# Patient Record
Sex: Male | Born: 1950 | Race: White | Hispanic: No | Marital: Single | Smoking: Current every day smoker
Health system: Southern US, Community
[De-identification: ages and names within clinical notes are randomized; demographics above are authoritative.]

## PROBLEM LIST (undated history)

## (undated) DIAGNOSIS — I509 Heart failure, unspecified: Secondary | ICD-10-CM

## (undated) DIAGNOSIS — C4499 Other specified malignant neoplasm of skin, unspecified: Secondary | ICD-10-CM

## (undated) DIAGNOSIS — E785 Hyperlipidemia, unspecified: Secondary | ICD-10-CM

## (undated) DIAGNOSIS — F419 Anxiety disorder, unspecified: Secondary | ICD-10-CM

## (undated) DIAGNOSIS — I219 Acute myocardial infarction, unspecified: Secondary | ICD-10-CM

## (undated) DIAGNOSIS — N400 Enlarged prostate without lower urinary tract symptoms: Secondary | ICD-10-CM

## (undated) DIAGNOSIS — F17200 Nicotine dependence, unspecified, uncomplicated: Secondary | ICD-10-CM

## (undated) DIAGNOSIS — I10 Essential (primary) hypertension: Secondary | ICD-10-CM

## (undated) DIAGNOSIS — F319 Bipolar disorder, unspecified: Secondary | ICD-10-CM

## (undated) DIAGNOSIS — M1990 Unspecified osteoarthritis, unspecified site: Secondary | ICD-10-CM

## (undated) DIAGNOSIS — I2510 Atherosclerotic heart disease of native coronary artery without angina pectoris: Secondary | ICD-10-CM

## (undated) DIAGNOSIS — J449 Chronic obstructive pulmonary disease, unspecified: Secondary | ICD-10-CM

## (undated) DIAGNOSIS — S0990XA Unspecified injury of head, initial encounter: Secondary | ICD-10-CM

## (undated) DIAGNOSIS — F1021 Alcohol dependence, in remission: Secondary | ICD-10-CM

## (undated) DIAGNOSIS — C801 Malignant (primary) neoplasm, unspecified: Secondary | ICD-10-CM

## (undated) HISTORY — DX: Anxiety disorder, unspecified: F41.9

## (undated) HISTORY — DX: Unspecified osteoarthritis, unspecified site: M19.90

## (undated) HISTORY — PX: CHOLECYSTECTOMY: SHX55

## (undated) HISTORY — DX: Atherosclerotic heart disease of native coronary artery without angina pectoris: I25.10

## (undated) HISTORY — DX: Nicotine dependence, unspecified, uncomplicated: F17.200

## (undated) HISTORY — PX: CARDIAC CATHETERIZATION: SHX172

## (undated) HISTORY — DX: Unspecified injury of head, initial encounter: S09.90XA

## (undated) HISTORY — DX: Acute myocardial infarction, unspecified: I21.9

## (undated) HISTORY — DX: Benign prostatic hyperplasia without lower urinary tract symptoms: N40.0

## (undated) HISTORY — PX: CARDIAC SURGERY: SHX584

## (undated) HISTORY — PX: APPENDECTOMY: SHX54

## (undated) HISTORY — DX: Bipolar disorder, unspecified: F31.9

## (undated) HISTORY — PX: COLON SURGERY: SHX602

## (undated) HISTORY — PX: CORONARY ANGIOPLASTY WITH STENT PLACEMENT: SHX49

## (undated) HISTORY — PX: CORONARY ANGIOPLASTY: SHX604

## (undated) HISTORY — DX: Hyperlipidemia, unspecified: E78.5

## (undated) HISTORY — DX: Alcohol dependence, in remission: F10.21

## (undated) HISTORY — PX: LIVER SURGERY: SHX698

## (undated) HISTORY — PX: ABDOMINAL SURGERY: SHX537

## (undated) HISTORY — DX: Other specified malignant neoplasm of skin, unspecified: C44.99

---

## 2013-07-02 HISTORY — PX: FOOT SURGERY: SHX648

## 2013-07-02 HISTORY — PX: OTHER SURGICAL HISTORY: SHX169

## 2013-07-02 HISTORY — PX: LYMPH NODE BIOPSY: SHX201

## 2014-10-01 DIAGNOSIS — S0990XA Unspecified injury of head, initial encounter: Secondary | ICD-10-CM

## 2014-10-01 HISTORY — DX: Unspecified injury of head, initial encounter: S09.90XA

## 2016-04-02 ENCOUNTER — Emergency Department: Payer: Medicare Other

## 2016-04-02 ENCOUNTER — Inpatient Hospital Stay
Admission: EM | Admit: 2016-04-02 | Discharge: 2016-04-04 | DRG: 190 | Disposition: A | Discharge status: Home / Self Care | Payer: Medicare Other | Attending: Internal Medicine | Admitting: Internal Medicine

## 2016-04-02 ENCOUNTER — Encounter: Payer: Self-pay | Admitting: Emergency Medicine

## 2016-04-02 DIAGNOSIS — F1290 Cannabis use, unspecified, uncomplicated: Secondary | ICD-10-CM | POA: Diagnosis present

## 2016-04-02 DIAGNOSIS — D696 Thrombocytopenia, unspecified: Secondary | ICD-10-CM | POA: Diagnosis present

## 2016-04-02 DIAGNOSIS — I509 Heart failure, unspecified: Secondary | ICD-10-CM | POA: Diagnosis present

## 2016-04-02 DIAGNOSIS — E876 Hypokalemia: Secondary | ICD-10-CM | POA: Diagnosis present

## 2016-04-02 DIAGNOSIS — Z8249 Family history of ischemic heart disease and other diseases of the circulatory system: Secondary | ICD-10-CM

## 2016-04-02 DIAGNOSIS — Z79899 Other long term (current) drug therapy: Secondary | ICD-10-CM

## 2016-04-02 DIAGNOSIS — I10 Essential (primary) hypertension: Secondary | ICD-10-CM

## 2016-04-02 DIAGNOSIS — K760 Fatty (change of) liver, not elsewhere classified: Secondary | ICD-10-CM | POA: Diagnosis present

## 2016-04-02 DIAGNOSIS — E871 Hypo-osmolality and hyponatremia: Secondary | ICD-10-CM | POA: Diagnosis present

## 2016-04-02 DIAGNOSIS — R042 Hemoptysis: Secondary | ICD-10-CM | POA: Diagnosis present

## 2016-04-02 DIAGNOSIS — Z23 Encounter for immunization: Secondary | ICD-10-CM | POA: Diagnosis not present

## 2016-04-02 DIAGNOSIS — I700 Atherosclerosis of aorta: Secondary | ICD-10-CM | POA: Diagnosis present

## 2016-04-02 DIAGNOSIS — J441 Chronic obstructive pulmonary disease with (acute) exacerbation: Secondary | ICD-10-CM | POA: Diagnosis present

## 2016-04-02 DIAGNOSIS — I712 Thoracic aortic aneurysm, without rupture: Secondary | ICD-10-CM | POA: Diagnosis present

## 2016-04-02 DIAGNOSIS — J440 Chronic obstructive pulmonary disease with acute lower respiratory infection: Principal | ICD-10-CM | POA: Diagnosis present

## 2016-04-02 DIAGNOSIS — Z85828 Personal history of other malignant neoplasm of skin: Secondary | ICD-10-CM

## 2016-04-02 DIAGNOSIS — R05 Cough: Secondary | ICD-10-CM | POA: Diagnosis not present

## 2016-04-02 DIAGNOSIS — F17210 Nicotine dependence, cigarettes, uncomplicated: Secondary | ICD-10-CM | POA: Diagnosis present

## 2016-04-02 DIAGNOSIS — I110 Hypertensive heart disease with heart failure: Secondary | ICD-10-CM | POA: Diagnosis present

## 2016-04-02 DIAGNOSIS — J189 Pneumonia, unspecified organism: Secondary | ICD-10-CM

## 2016-04-02 HISTORY — DX: Heart failure, unspecified: I50.9

## 2016-04-02 HISTORY — DX: Essential (primary) hypertension: I10

## 2016-04-02 HISTORY — DX: Chronic obstructive pulmonary disease, unspecified: J44.9

## 2016-04-02 HISTORY — DX: Malignant (primary) neoplasm, unspecified: C80.1

## 2016-04-02 LAB — MRSA PCR SCREENING: MRSA BY PCR: NEGATIVE

## 2016-04-02 LAB — CBC
HCT: 41.3 % (ref 40.0–52.0)
HEMOGLOBIN: 14.7 g/dL (ref 13.0–18.0)
MCH: 34.7 pg — ABNORMAL HIGH (ref 26.0–34.0)
MCHC: 35.7 g/dL (ref 32.0–36.0)
MCV: 97.1 fL (ref 80.0–100.0)
Platelets: 119 10*3/uL — ABNORMAL LOW (ref 150–440)
RBC: 4.25 MIL/uL — AB (ref 4.40–5.90)
RDW: 14.1 % (ref 11.5–14.5)
WBC: 6.1 10*3/uL (ref 3.8–10.6)

## 2016-04-02 LAB — BLOOD GAS, VENOUS
Acid-Base Excess: 1.9 mmol/L (ref 0.0–2.0)
Bicarbonate: 26.6 mmol/L (ref 20.0–28.0)
O2 Saturation: 85 %
PH VEN: 7.42 (ref 7.250–7.430)
Patient temperature: 37
pCO2, Ven: 41 mmHg — ABNORMAL LOW (ref 44.0–60.0)
pO2, Ven: 49 mmHg — ABNORMAL HIGH (ref 32.0–45.0)

## 2016-04-02 LAB — COMPREHENSIVE METABOLIC PANEL
ALBUMIN: 3.7 g/dL (ref 3.5–5.0)
ALK PHOS: 76 U/L (ref 38–126)
ALT: 19 U/L (ref 17–63)
ANION GAP: 6 (ref 5–15)
AST: 22 U/L (ref 15–41)
BILIRUBIN TOTAL: 1.3 mg/dL — AB (ref 0.3–1.2)
BUN: 7 mg/dL (ref 6–20)
CALCIUM: 8.5 mg/dL — AB (ref 8.9–10.3)
CO2: 26 mmol/L (ref 22–32)
Chloride: 97 mmol/L — ABNORMAL LOW (ref 101–111)
Creatinine, Ser: 0.75 mg/dL (ref 0.61–1.24)
GFR calc non Af Amer: >60 mL/min (ref >60)
Glucose, Bld: 112 mg/dL — ABNORMAL HIGH (ref 65–99)
POTASSIUM: 3.4 mmol/L — AB (ref 3.5–5.1)
SODIUM: 129 mmol/L — AB (ref 135–145)
TOTAL PROTEIN: 7.2 g/dL (ref 6.5–8.1)

## 2016-04-02 LAB — TYPE AND SCREEN
ABO/RH(D): A POS
ANTIBODY SCREEN: NEGATIVE

## 2016-04-02 LAB — EXPECTORATED SPUTUM ASSESSMENT W GRAM STAIN, RFLX TO RESP C

## 2016-04-02 LAB — MAGNESIUM: MAGNESIUM: 1.6 mg/dL — AB (ref 1.7–2.4)

## 2016-04-02 LAB — EXPECTORATED SPUTUM ASSESSMENT W REFEX TO RESP CULTURE

## 2016-04-02 LAB — STREP PNEUMONIAE URINARY ANTIGEN: Strep Pneumo Urinary Antigen: NEGATIVE

## 2016-04-02 LAB — TROPONIN I

## 2016-04-02 MED ORDER — ARIPIPRAZOLE 5 MG PO TABS
5.0000 mg | ORAL_TABLET | Freq: Every day | ORAL | Status: DC
  Administered 2016-04-02 – 2016-04-04 (×3): 5 mg via ORAL
  Filled 2016-04-02 (×3): qty 1

## 2016-04-02 MED ORDER — GABAPENTIN 400 MG PO CAPS
800.0000 mg | ORAL_CAPSULE | Freq: Three times a day (TID) | ORAL | Status: DC
  Administered 2016-04-02 – 2016-04-04 (×7): 800 mg via ORAL
  Filled 2016-04-02 (×7): qty 2

## 2016-04-02 MED ORDER — ONDANSETRON HCL 4 MG/2ML IJ SOLN: 4.0000 mg | Freq: Four times a day (QID) | INTRAMUSCULAR | Status: DC | PRN

## 2016-04-02 MED ORDER — HYDROXYZINE HCL 25 MG PO TABS
25.0000 mg | ORAL_TABLET | Freq: Three times a day (TID) | ORAL | Status: DC | PRN
  Administered 2016-04-02 – 2016-04-04 (×2): 25 mg via ORAL
  Filled 2016-04-02 (×2): qty 1

## 2016-04-02 MED ORDER — NICOTINE 14 MG/24HR TD PT24
14.0000 mg | MEDICATED_PATCH | Freq: Every day | TRANSDERMAL | Status: DC
  Administered 2016-04-02 – 2016-04-04 (×3): 14 mg via TRANSDERMAL
  Filled 2016-04-02 (×3): qty 1

## 2016-04-02 MED ORDER — SODIUM CHLORIDE 0.9 % IV BOLUS (SEPSIS)
1000.0000 mL | Freq: Once | INTRAVENOUS | Status: AC
  Administered 2016-04-02: 1000 mL via INTRAVENOUS

## 2016-04-02 MED ORDER — FLUTICASONE PROPIONATE 50 MCG/ACT NA SUSP
1.0000 | Freq: Every day | NASAL | Status: DC | PRN
  Filled 2016-04-02: qty 16

## 2016-04-02 MED ORDER — POTASSIUM CHLORIDE CRYS ER 20 MEQ PO TBCR
40.0000 meq | EXTENDED_RELEASE_TABLET | Freq: Once | ORAL | Status: AC
  Administered 2016-04-02: 14:00:00 40 meq via ORAL
  Filled 2016-04-02: qty 2

## 2016-04-02 MED ORDER — FAMOTIDINE 20 MG PO TABS
20.0000 mg | ORAL_TABLET | Freq: Two times a day (BID) | ORAL | Status: DC
  Administered 2016-04-02 – 2016-04-04 (×5): 20 mg via ORAL
  Filled 2016-04-02 (×5): qty 1

## 2016-04-02 MED ORDER — OXYBUTYNIN CHLORIDE ER 5 MG PO TB24
5.0000 mg | ORAL_TABLET | Freq: Every day | ORAL | Status: DC
  Administered 2016-04-02 – 2016-04-03 (×2): 5 mg via ORAL
  Filled 2016-04-02 (×2): qty 1

## 2016-04-02 MED ORDER — MOMETASONE FURO-FORMOTEROL FUM 100-5 MCG/ACT IN AERO
2.0000 | INHALATION_SPRAY | Freq: Two times a day (BID) | RESPIRATORY_TRACT | Status: DC
  Administered 2016-04-02 – 2016-04-04 (×4): 2 via RESPIRATORY_TRACT
  Filled 2016-04-02: qty 8.8

## 2016-04-02 MED ORDER — SODIUM CHLORIDE 0.9 % IV SOLN
INTRAVENOUS | Status: DC
  Administered 2016-04-02 – 2016-04-03 (×2): via INTRAVENOUS

## 2016-04-02 MED ORDER — ENOXAPARIN SODIUM 40 MG/0.4ML ~~LOC~~ SOLN
40.0000 mg | SUBCUTANEOUS | Status: DC
  Administered 2016-04-02 – 2016-04-03 (×2): 40 mg via SUBCUTANEOUS
  Filled 2016-04-02 (×2): qty 0.4

## 2016-04-02 MED ORDER — LORATADINE 10 MG PO TABS
10.0000 mg | ORAL_TABLET | Freq: Every evening | ORAL | Status: DC
  Administered 2016-04-02 – 2016-04-03 (×2): 10 mg via ORAL
  Filled 2016-04-02 (×2): qty 1

## 2016-04-02 MED ORDER — ACETAMINOPHEN 325 MG PO TABS
650.0000 mg | ORAL_TABLET | Freq: Four times a day (QID) | ORAL | Status: DC | PRN
  Administered 2016-04-02: 650 mg via ORAL
  Filled 2016-04-02: qty 2

## 2016-04-02 MED ORDER — PRAVASTATIN SODIUM 40 MG PO TABS
20.0000 mg | ORAL_TABLET | Freq: Every day | ORAL | Status: DC
  Administered 2016-04-02 – 2016-04-03 (×2): 20 mg via ORAL
  Filled 2016-04-02 (×2): qty 1

## 2016-04-02 MED ORDER — TIOTROPIUM BROMIDE MONOHYDRATE 18 MCG IN CAPS
18.0000 ug | ORAL_CAPSULE | Freq: Every day | RESPIRATORY_TRACT | Status: DC
  Administered 2016-04-02 – 2016-04-04 (×3): 18 ug via RESPIRATORY_TRACT
  Filled 2016-04-02: qty 5

## 2016-04-02 MED ORDER — IOPAMIDOL (ISOVUE-300) INJECTION 61%
75.0000 mL | Freq: Once | INTRAVENOUS | Status: AC | PRN
  Administered 2016-04-02: 75 mL via INTRAVENOUS

## 2016-04-02 MED ORDER — GUAIFENESIN 100 MG/5ML PO SOLN: 5.0000 mL | ORAL | Status: DC | PRN

## 2016-04-02 MED ORDER — NITROGLYCERIN 0.4 MG SL SUBL: 0.4000 mg | SUBLINGUAL_TABLET | SUBLINGUAL | Status: DC | PRN

## 2016-04-02 MED ORDER — IPRATROPIUM-ALBUTEROL 0.5-2.5 (3) MG/3ML IN SOLN: 3.0000 mL | Freq: Four times a day (QID) | RESPIRATORY_TRACT | Status: DC | PRN

## 2016-04-02 MED ORDER — PREDNISONE 20 MG PO TABS
20.0000 mg | ORAL_TABLET | Freq: Every day | ORAL | Status: DC
Start: 2016-04-03 — End: 2016-04-04
  Administered 2016-04-03 – 2016-04-04 (×2): 20 mg via ORAL
  Filled 2016-04-02 (×2): qty 1

## 2016-04-02 MED ORDER — INFLUENZA VAC SPLIT QUAD 0.5 ML IM SUSY
0.5000 mL | PREFILLED_SYRINGE | INTRAMUSCULAR | Status: AC
  Administered 2016-04-03: 0.5 mL via INTRAMUSCULAR
  Filled 2016-04-02: qty 0.5

## 2016-04-02 MED ORDER — DEXTROSE 5 % IV SOLN
1.0000 g | Freq: Once | INTRAVENOUS | Status: AC
  Administered 2016-04-02: 1 g via INTRAVENOUS
  Filled 2016-04-02: qty 10

## 2016-04-02 MED ORDER — DEXTROSE 5 % IV SOLN
1.0000 g | INTRAVENOUS | Status: DC
  Administered 2016-04-03 – 2016-04-04 (×2): 1 g via INTRAVENOUS
  Filled 2016-04-02 (×2): qty 10

## 2016-04-02 MED ORDER — AZITHROMYCIN 500 MG IV SOLR
500.0000 mg | INTRAVENOUS | Status: DC
  Administered 2016-04-02 – 2016-04-04 (×3): 500 mg via INTRAVENOUS
  Filled 2016-04-02 (×3): qty 500

## 2016-04-02 NOTE — Clinical Social Work Note (Signed)
Clinical Social Work Assessment  Patient Details  Name: Victor Chapman MRN: [id number] Date of Birth: [birth year]  Date of referral:  04/02/16               Reason for consult:  Discharge Planning                Permission sought to share information with:    Permission granted to share information::     Name::        Agency::     Relationship::     Contact Information:     Housing/Transportation Living arrangements for the past 2 months:  Single Family Home Source of Information:  Patient Patient Interpreter Needed:  None Criminal Activity/Legal Involvement Pertinent to Current Situation/Hospitalization:  No - Comment as needed Significant Relationships:  Friend Lives with:  Friends Do you feel safe going back to the place where you live?  Yes Need for family participation in patient care:  No (Coment)  Care giving concerns:  Patient repots that he's homeless.    Social Worker assessment / plan:  CSW met with patient at bedside. Introduced herself and her role. Per patient he's living with a friend. Stated that he'll return at discharge and that his friend will pick him up. Stated he's been there for about 1 week. Reported that his brother died recently and he was taking care of him before he died. Stated he left his brother's house because he couldn't stay there. Stated that he is interested in resources in this area. CSW provided patient with [facility] [employer] and explained each resource to patient. Patient stated he'd use them if he needed them.   Employment status:  Unemployed Nurse, adult PT Recommendations:  Not assessed at this time Information / Referral to community resources:   (Homeless Resources )  Patient/Family's Response to care:  Patient thanked CSW for assistance.  Patient/Family's Understanding of and Emotional Response to Diagnosis, Current Treatment, and Prognosis:  Patient reported he understood  Emotional  Assessment Appearance:  Appears stated age Attitude/Demeanor/Rapport:   (None) Affect (typically observed):  Accepting, Calm, Pleasant Orientation:  Oriented to Self, Oriented to Place, Oriented to  Time, Oriented to Situation Alcohol / Substance use:  Not Applicable Psych involvement (Current and /or in the community):  No (Comment)  Discharge Needs  Concerns to be addressed:  Discharge Planning Concerns Readmission within the last 30 days:  No Current discharge risk:  Chronically ill Barriers to Discharge:  Continued Medical Work up   [employer], [place] 04/02/2016, 4:43 PM

## 2016-04-02 NOTE — ED Notes (Signed)
Attempted IV x2. Called Dawn RN to attempt IV.

## 2016-04-02 NOTE — Progress Notes (Signed)
While rounding nurse referred me to 128A for education on an AD. Ch went over the material and left the booklet with the Pt. [place] informed PT to let the nurse know when he was ready to move forward. CH is available for follow up as needed.    04/02/16 1100  Clinical Encounter Type  Visited With Patient  Visit Type Initial;Spiritual support;Other (Comment) (AD)  Referral From Nurse  Spiritual Encounters  Spiritual Needs Literature;Prayer

## 2016-04-02 NOTE — Progress Notes (Signed)
Pharmacy Note  Renal dose adjust abx: Pt currently ordered azithromycin and ceftriaxone. No dose adjustments for renal function needed.   Rayna Sexton, PharmD, BCPS Clinical Pharmacist 04/02/2016 10:11 AM

## 2016-04-02 NOTE — Consult Note (Signed)
[place] [facility] Consultation      Date: 04/02/2016,   MRN# [id number] Victor Chapman 08-03-1950 Code Status:     Code Status Orders        Start     Ordered   04/02/16 0939  Full code  Continuous     04/02/16 0938    Code Status History    Date Active Date Inactive Code Status Order ID Comments User Context   This patient has a current code status but no historical code status.     Hosp day:@LENGTHOFSTAYDAYS @ Referring MD: @ATDPROV @     PCP:      AdmissionWeight: 190 lb (86.2 kg)                 CurrentWeight: 189 lb 3 oz (85.8 kg) Victor Chapman is a 65 y.o. old male seen in consultation for hemoptysis at the request of Dr. Bridgett Larsson.     CHIEF COMPLAINT:   Coughing up blood   HISTORY OF PRESENT ILLNESS  66 y.o. male with a known history of HTN, COPD, CHF and cancer.  -The patient complained of cough and spitted up blood since last night.  -He said that he had smoked marijuana when symptoms  started yesterday. - He also has fever,  chills and mild shortness of breath. - The patient reported that he stopped taking his medication about 1 week ago.   Chest x-ray didn't show any acute cardiopulmonary disease the CAT scan show multifocal pneumonia   He has chronic SOB and DOE at baseline Has long standing extensive smoking history He feels better now. No more hemoptysis   PAST MEDICAL HISTORY   Past Medical History:  Diagnosis Date  . Cancer ([place])    skin  . CHF (congestive heart failure) ([place])   . COPD (chronic obstructive pulmonary disease) ([place])   . Hypertension      SURGICAL HISTORY   Past Surgical History:  Procedure Laterality Date  . ABDOMINAL SURGERY    . APPENDECTOMY     Part   . CARDIAC SURGERY    . CHOLECYSTECTOMY    . COLON SURGERY    . LIVER SURGERY     Part removed     FAMILY HISTORY   Family History  Problem Relation Age of Onset  . Heart disease Mother      SOCIAL HISTORY   Social History  Substance Use  Topics  . Smoking status: Current Every Day Smoker    Packs/day: 1.00    Years: 50.00  . Smokeless tobacco: Never Used  . Alcohol use No     MEDICATIONS    Home Medication:    Current Medication:  Current Facility-Administered Medications:  .  0.9 %  sodium chloride infusion, , Intravenous, Continuous, Demetrios Loll, MD, Last Rate: 75 mL/hr at 04/02/16 1409 .  acetaminophen (TYLENOL) tablet 650 mg, 650 mg, Oral, Q6H PRN, Demetrios Loll, MD, 650 mg at 04/02/16 1204 .  ARIPiprazole (ABILIFY) tablet 5 mg, 5 mg, Oral, Daily, Demetrios Loll, MD, 5 mg at 04/02/16 1204 .  azithromycin (ZITHROMAX) 500 mg in dextrose 5 % 250 mL IVPB, 500 mg, Intravenous, Q24H, Demetrios Loll, MD, 500 mg at 04/02/16 1017 .  [START ON 04/03/2016] cefTRIAXone (ROCEPHIN) 1 g in dextrose 5 % 50 mL IVPB, 1 g, Intravenous, Q24H, Demetrios Loll, MD .  enoxaparin (LOVENOX) injection 40 mg, 40 mg, Subcutaneous, Q24H, Demetrios Loll, MD .  famotidine (PEPCID) tablet 20 mg, 20 mg, Oral, BID, Demetrios Loll, MD, 20  mg at 04/02/16 1146 .  fluticasone (FLONASE) 50 MCG/ACT nasal spray 1 spray, 1 spray, Each Nare, Daily PRN, Demetrios Loll, MD .  gabapentin (NEURONTIN) capsule 800 mg, 800 mg, Oral, TID, Demetrios Loll, MD, 800 mg at 04/02/16 1543 .  guaiFENesin (ROBITUSSIN) 100 MG/5ML solution 100 mg, 5 mL, Oral, Q4H PRN, Demetrios Loll, MD .  hydrOXYzine (ATARAX/VISTARIL) tablet 25 mg, 25 mg, Oral, TID PRN, Demetrios Loll, MD .  Derrill Memo ON 04/03/2016] Influenza vac split quadrivalent PF (FLUARIX) injection 0.5 mL, 0.5 mL, Intramuscular, Tomorrow-1000, Demetrios Loll, MD .  ipratropium-albuterol (DUONEB) 0.5-2.5 (3) MG/3ML nebulizer solution 3 mL, 3 mL, Nebulization, Q6H PRN, Demetrios Loll, MD .  loratadine (CLARITIN) tablet 10 mg, 10 mg, Oral, QPM, Demetrios Loll, MD .  nicotine (NICODERM CQ - dosed in mg/24 hours) patch 14 mg, 14 mg, Transdermal, Daily, Demetrios Loll, MD, 14 mg at 04/02/16 1017 .  nitroGLYCERIN (NITROSTAT) SL tablet 0.4 mg, 0.4 mg, Sublingual, Q5 min PRN, Demetrios Loll, MD .   ondansetron [facility]) injection 4 mg, 4 mg, Intravenous, Q6H PRN, Demetrios Loll, MD .  oxybutynin (DITROPAN-XL) 24 hr tablet 5 mg, 5 mg, Oral, QHS, Demetrios Loll, MD .  pravastatin (PRAVACHOL) tablet 20 mg, 20 mg, Oral, q1800, Demetrios Loll, MD .  tiotropium [facility]) inhalation capsule 18 mcg, 18 mcg, Inhalation, Daily, Demetrios Loll, MD, 18 mcg at 04/02/16 1205    ALLERGIES   Review of patient's allergies indicates no known allergies.     REVIEW OF SYSTEMS   Review of Systems  Constitutional: Negative for chills, diaphoresis, fever, malaise/fatigue and weight loss.  HENT: Negative for congestion and hearing loss.   Eyes: Negative for blurred vision and double vision.  Respiratory: Positive for cough, hemoptysis, shortness of breath and wheezing. Negative for sputum production.   Cardiovascular: Negative for chest pain, palpitations and orthopnea.  Gastrointestinal: Negative for abdominal pain, heartburn, nausea and vomiting.  Genitourinary: Negative for dysuria and urgency.  Musculoskeletal: Negative for back pain, myalgias and neck pain.  Skin: Negative for rash.  Neurological: Negative for dizziness, tingling, tremors, weakness and headaches.  Endo/Heme/Allergies: Does not bruise/bleed easily.  Psychiatric/Behavioral: Negative for depression, substance abuse and suicidal ideas.  All other systems reviewed and are negative.    VS: BP (!) 149/92 (BP Location: Right Arm)   Pulse 71   Temp 97.8 F (36.6 C) (Oral)   Resp (!) 29   Ht 5\' 9"  (1.753 m)   Wt 189 lb 3 oz (85.8 kg)   SpO2 97%   BMI 27.94 kg/m      PHYSICAL EXAM  Physical Exam  Constitutional: He is oriented to person, place, and time. He appears well-developed and well-nourished. No distress.  HENT:  Head: Normocephalic and atraumatic.  Mouth/Throat: No oropharyngeal exudate.  Eyes: EOM are normal. Pupils are equal, round, and reactive to light. No scleral icterus.  Neck: Normal range of motion. Neck supple.    Cardiovascular: Normal rate, regular rhythm and normal heart sounds.   No murmur heard. Pulmonary/Chest: No stridor. No respiratory distress. He has wheezes. He has no rales. He exhibits no tenderness.  Abdominal: Soft. Bowel sounds are normal.  Musculoskeletal: Normal range of motion. He exhibits no edema.  Neurological: He is alert and oriented to person, place, and time. No cranial nerve deficit.  Skin: Skin is warm. He is not diaphoretic.  Psychiatric: He has a normal mood and affect.        LABS    Recent Labs  04/02/16  0156  HGB  14.7  HCT  41.3  MCV  97.1  WBC  6.1  BUN  7  CREATININE  0.75  GLUCOSE  112*  CALCIUM  8.5*  ,      CULTURE RESULTS   Recent Results (from the past 240 hour(s))  MRSA PCR Screening     Status: None   Collection Time: 04/02/16 10:07 AM  Result Value Ref Range Status   MRSA by PCR NEGATIVE NEGATIVE Final    Comment:        The GeneXpert MRSA Assay (FDA approved for NASAL specimens only), is one component of a comprehensive MRSA colonization surveillance program. It is not intended to diagnose MRSA infection nor to guide or monitor treatment for MRSA infections.   Culture, sputum-assessment     Status: None   Collection Time: 04/02/16  2:10 PM  Result Value Ref Range Status   Specimen Description EXPECTORATED SPUTUM  Final   Special Requests NONE  Final   Sputum evaluation THIS SPECIMEN IS ACCEPTABLE FOR SPUTUM CULTURE  Final   Report Status 04/02/2016 FINAL  Final          IMAGING    Dg Chest 2 View  Result Date: 04/02/2016 CLINICAL DATA:  65 y/o  M; 2 days of coughing up blood. EXAM: CHEST  2 VIEW COMPARISON:  None. FINDINGS: Normal cardiomediastinal silhouette. Nodular density in the right costodiaphragmatic recess measuring 15 mm. No focal consolidation. No pleural effusion or pneumothorax. Mild multilevel degenerative changes of the spine. IMPRESSION: No active cardiopulmonary disease. Nodular density  projecting over right costodiaphragmatic recess of uncertain significance, consider CT of the chest for further characterization. Electronically Signed   By: Kristine Garbe M.D.   On: 04/02/2016 04:01   Ct Chest W Contrast  Result Date: 04/02/2016 CLINICAL DATA:  Hemoptysis beginning after smoking.  History of CHF. EXAM: CT CHEST WITH CONTRAST TECHNIQUE: Multidetector CT imaging of the chest was performed during intravenous contrast administration. CONTRAST:  46mL ISOVUE-300 IOPAMIDOL (ISOVUE-300) INJECTION 61% COMPARISON:  Chest radiograph 04/02/2016 FINDINGS: Cardiovascular: Normal heart size. Prominent coronary artery calcifications. Normal caliber thoracic aorta with calcific and noncalcific plaque formation. Suggestion of penetrating ulcers in the lower descending thoracic aorta. No dissection. Great vessel origins are patent. Mediastinum/Nodes: No enlarged mediastinal, hilar, or axillary lymph nodes. Thyroid gland, trachea, and esophagus demonstrate no significant findings. Lungs/Pleura: Multiple patchy focal areas of airspace infiltration in both lungs likely to represent multifocal pneumonia. In the setting of hemoptysis, parenchymal hemorrhage could also have this appearance. No focal nodules demonstrated in the right costophrenic angle region. No pleural effusions. No pneumothorax. Airways are patent. Upper Abdomen: Diffuse fatty infiltration of the liver. Surgical absence of the gallbladder. Pneumobilia is likely postoperative. Spleen is mildly enlarged. Musculoskeletal: Mild degenerative changes in the spine and at sternomanubrial joint. No destructive bone lesions. IMPRESSION: Patchy airspace infiltrates in the lungs suggesting multifocal pneumonia. Prominent coronary artery calcifications. Aortic atherosclerosis with suggestion of penetrating ulcers in the descending aorta. Fatty infiltration of the liver. Pneumobilia is likely postoperative. Electronically Signed   By: Lucienne Capers  M.D.   On: 04/02/2016 05:52     Images reviewed 04/02/2016 GGO mostly seen in [facility]     ASSESSMENT/PLAN   65 yo white male seen today for acute hemoptysis from acute bronchitis/pneumonia in setting of high probability of COPD exacerbation  1.continue abx as prescribed 2.start oral prednisone 20 mg daily for 7 days 3.start dulera 4.continue spiriva  Follow up in [facility] in 2-4 weeks after discharge    I have personally obtained a history, examined the patient, evaluated laboratory and independently reviewed imaging results, formulated the assessment and plan and placed orders.  The Patient requires high complexity decision making for assessment and support, frequent evaluation and titration of therapies, application of advanced monitoring technologies and extensive interpretation of multiple databases.   Patient are satisfied with Plan of action and management. All questions answered  Corrin Parker, M.D.  [facility]  Medical Director [place] Director [facility] Cardio-Pulmonary Department

## 2016-04-02 NOTE — Consult Note (Signed)
[facility] Consult Note  MRN : [id number]  Victor Chapman is a 65 y.o. (Sep 04, 1950) male who presents with chief complaint of  Chief Complaint  Patient presents with  . Hematemesis  .  History of Present Illness: I am asked to see the patient by Dr. Bridgett Larsson for evaluation of an abnormality seen on a CT scan. The patient was admitted with hemoptysis and coughing. He is somewhat somnolent at my exam today, but his coughing has improved and he does not appear to be having any worsening symptoms. He does not complain of significant chest or back pain. He has no current shortness of breath. He has no fever or chills. Given his extensive tobacco history and other risk factors a CT scan was performed which I have independently reviewed. This is described below, but from a vascular standpoint significant irregularity is seen in the descending thoracic aorta. This is interpreted as a penetrating ulcer and there may be a small penetrating ulcer, but there is significant mural thrombus and irregularity throughout much of the descending thoracic aorta. There is no aneurysm and no stenosis present at this time. He does not currently have any signs of peripheral embolization and does not describe claudication symptoms.  Current Facility-Administered Medications  Medication Dose Route Frequency Provider Last Rate Last Dose  . 0.9 %  sodium chloride infusion   Intravenous Continuous Demetrios Loll, MD 75 mL/hr at 04/02/16 1409    . acetaminophen (TYLENOL) tablet 650 mg  650 mg Oral Q6H PRN Demetrios Loll, MD   650 mg at 04/02/16 1204  . ARIPiprazole (ABILIFY) tablet 5 mg  5 mg Oral Daily Demetrios Loll, MD   5 mg at 04/02/16 1204  . azithromycin (ZITHROMAX) 500 mg in dextrose 5 % 250 mL IVPB  500 mg Intravenous Q24H Demetrios Loll, MD   500 mg at 04/02/16 1017  . [START ON 04/03/2016] cefTRIAXone (ROCEPHIN) 1 g in dextrose 5 % 50 mL IVPB  1 g Intravenous Q24H Demetrios Loll, MD      . enoxaparin (LOVENOX)  injection 40 mg  40 mg Subcutaneous Q24H Demetrios Loll, MD      . famotidine (PEPCID) tablet 20 mg  20 mg Oral BID Demetrios Loll, MD   20 mg at 04/02/16 1146  . fluticasone (FLONASE) 50 MCG/ACT nasal spray 1 spray  1 spray Each Nare Daily PRN Demetrios Loll, MD      . gabapentin (NEURONTIN) capsule 800 mg  800 mg Oral TID Demetrios Loll, MD   800 mg at 04/02/16 1147  . guaiFENesin (ROBITUSSIN) 100 MG/5ML solution 100 mg  5 mL Oral Q4H PRN Demetrios Loll, MD      . hydrOXYzine (ATARAX/VISTARIL) tablet 25 mg  25 mg Oral TID PRN Demetrios Loll, MD      . Derrill Memo ON 04/03/2016] Influenza vac split quadrivalent PF (FLUARIX) injection 0.5 mL  0.5 mL Intramuscular Tomorrow-1000 Demetrios Loll, MD      . ipratropium-albuterol (DUONEB) 0.5-2.5 (3) MG/3ML nebulizer solution 3 mL  3 mL Nebulization Q6H PRN Demetrios Loll, MD      . loratadine (CLARITIN) tablet 10 mg  10 mg Oral QPM Demetrios Loll, MD      . nicotine (NICODERM CQ - dosed in mg/24 hours) patch 14 mg  14 mg Transdermal Daily Demetrios Loll, MD   14 mg at 04/02/16 1017  . nitroGLYCERIN (NITROSTAT) SL tablet 0.4 mg  0.4 mg Sublingual Q5 min PRN Demetrios Loll, MD      .  ondansetron (ZOFRAN) injection 4 mg  4 mg Intravenous Q6H PRN Demetrios Loll, MD      . oxybutynin (DITROPAN-XL) 24 hr tablet 5 mg  5 mg Oral QHS Demetrios Loll, MD      . pravastatin (PRAVACHOL) tablet 20 mg  20 mg Oral q1800 Demetrios Loll, MD      . tiotropium [facility]) inhalation capsule 18 mcg  18 mcg Inhalation Daily Demetrios Loll, MD   18 mcg at 04/02/16 1205    Past Medical History:  Diagnosis Date  . Cancer ([place])    skin  . CHF (congestive heart failure) ([place])   . COPD (chronic obstructive pulmonary disease) ([place])   . Hypertension     Past Surgical History:  Procedure Laterality Date  . ABDOMINAL SURGERY    . APPENDECTOMY     Part   . CARDIAC SURGERY    . CHOLECYSTECTOMY    . COLON SURGERY    . LIVER SURGERY     Part removed    Social History Social History  Substance Use Topics  . Smoking status: Current Every Day Smoker     Packs/day: 1.00    Years: 50.00  . Smokeless tobacco: Never Used  . Alcohol use No  No IV drug use  Family History Family History  Problem Relation Age of Onset  . Heart disease Mother   No family history of clotting disorders, bleeding disorders, or aneurysms reported  No Known Allergies   REVIEW OF SYSTEMS (Negative unless checked)  Constitutional: [] Weight loss  [] Fever  [] Chills Cardiac: [] Chest pain   [] Chest pressure   [] Palpitations   [] Shortness of breath when laying flat   [] Shortness of breath at rest   [] Shortness of breath with exertion. Vascular:  [] Pain in legs with walking   [] Pain in legs at rest   [] Pain in legs when laying flat   [] Claudication   [] Pain in feet when walking  [] Pain in feet at rest  [] Pain in feet when laying flat   [] History of DVT   [] Phlebitis   [] Swelling in legs   [] Varicose veins   [] Non-healing ulcers Pulmonary:   [] Uses home oxygen   [x] Productive cough   [x] Hemoptysis   [] Wheeze  [x] COPD   [] Asthma Neurologic:  [] Dizziness  [] Blackouts   [] Seizures   [] History of stroke   [] History of TIA  [] Aphasia   [] Temporary blindness   [] Dysphagia   [] Weakness or numbness in arms   [] Weakness or numbness in legs Musculoskeletal:  [] Arthritis   [] Joint swelling   [] Joint pain   [] Low back pain Hematologic:  [] Easy bruising  [] Easy bleeding   [] Hypercoagulable state   [] Anemic  [] Hepatitis Gastrointestinal:  [] Blood in stool   [] Vomiting blood  [x] Gastroesophageal reflux/heartburn   [] Difficulty swallowing. Genitourinary:  [] Chronic kidney disease   [] Difficult urination  [] Frequent urination  [] Burning with urination   [] Blood in urine Skin:  [] Rashes   [] Ulcers   [] Wounds Psychological:  [] History of anxiety   []  History of major depression.  Physical Examination  Vitals:   04/02/16 0900 04/02/16 0939 04/02/16 0955 04/02/16 1418  BP: (!) 168/81  (!) 158/90 (!) 149/92  Pulse: 61  66 71  Resp: (!) 29     Temp:   97.8 F (36.6 C) 97.8 F (36.6 C)   TempSrc:   Oral Oral  SpO2:  97% 98% 97%  Weight:   85.8 kg (189 lb 3 oz)   Height:   5\' 9"  (1.753 m)  Body mass index is 27.94 kg/m. Gen:  WD/WN, NAD Head: [place]/AT, No temporalis wasting. Prominent temp pulse not noted. Ear/Nose/Throat: Hearing grossly intact, nares w/o erythema or drainage, oropharynx w/o Erythema/Exudate Eyes: PERRLA, EOMI.  Neck: Supple, no nuchal rigidity.  No bruit or JVD.  Pulmonary:  Good air movement, Equal bilaterally.  Cardiac: RRR, normal S1, S2. Vascular:  Vessel Right Left  Radial Palpable Palpable  Ulnar Palpable Palpable  Brachial Palpable Palpable  Carotid Palpable, without bruit Palpable, without bruit  Aorta Not palpable N/A  Femoral Palpable Palpable  Popliteal Palpable Not Palpable  PT Not Palpable Not Palpable  DP Palpable Palpable   Gastrointestinal: soft, non-tender/non-distended. No guarding/reflex. No masses, surgical incisions, or scars. Musculoskeletal: M/S 5/5 throughout.  No deformity or atrophy. No edema. Neurologic: CN 2-12 intact. Pain and light touch intact in extremities.  Symmetrical.  Speech is fluent. Motor exam as listed above. Psychiatric: Patient is somewhat somnolent, but awakens and answers questions appropriately. Affect seems normal Dermatologic: No rashes or ulcers noted.  No cellulitis or open wounds. Lymph : No Cervical, Axillary, or Inguinal lymphadenopathy.     CBC Lab Results  Component Value Date   WBC 6.1 04/02/2016   HGB 14.7 04/02/2016   HCT 41.3 04/02/2016   MCV 97.1 04/02/2016   PLT 119 (L) 04/02/2016    BMET    Component Value Date/Time   NA 129 (L) 04/02/2016 0156   K 3.4 (L) 04/02/2016 0156   CL 97 (L) 04/02/2016 0156   CO2 26 04/02/2016 0156   GLUCOSE 112 (H) 04/02/2016 0156   BUN 7 04/02/2016 0156   CREATININE 0.75 04/02/2016 0156   CALCIUM 8.5 (L) 04/02/2016 0156   GFRNONAA >60 04/02/2016 0156   GFRAA >60 04/02/2016 0156   Estimated Creatinine Clearance: 98.5 mL/min (by C-G  formula based on SCr of 0.75 mg/dL).  COAG No results found for: INR, PROTIME  Radiology Dg Chest 2 View  Result Date: 04/02/2016 CLINICAL DATA:  65 y/o  M; 2 days of coughing up blood. EXAM: CHEST  2 VIEW COMPARISON:  None. FINDINGS: Normal cardiomediastinal silhouette. Nodular density in the right costodiaphragmatic recess measuring 15 mm. No focal consolidation. No pleural effusion or pneumothorax. Mild multilevel degenerative changes of the spine. IMPRESSION: No active cardiopulmonary disease. Nodular density projecting over right costodiaphragmatic recess of uncertain significance, consider CT of the chest for further characterization. Electronically Signed   By: Kristine Garbe M.D.   On: 04/02/2016 04:01   Ct Chest W Contrast  Result Date: 04/02/2016 CLINICAL DATA:  Hemoptysis beginning after smoking.  History of CHF. EXAM: CT CHEST WITH CONTRAST TECHNIQUE: Multidetector CT imaging of the chest was performed during intravenous contrast administration. CONTRAST:  23mL ISOVUE-300 IOPAMIDOL (ISOVUE-300) INJECTION 61% COMPARISON:  Chest radiograph 04/02/2016 FINDINGS: Cardiovascular: Normal heart size. Prominent coronary artery calcifications. Normal caliber thoracic aorta with calcific and noncalcific plaque formation. Suggestion of penetrating ulcers in the lower descending thoracic aorta. No dissection. Great vessel origins are patent. Mediastinum/Nodes: No enlarged mediastinal, hilar, or axillary lymph nodes. Thyroid gland, trachea, and esophagus demonstrate no significant findings. Lungs/Pleura: Multiple patchy focal areas of airspace infiltration in both lungs likely to represent multifocal pneumonia. In the setting of hemoptysis, parenchymal hemorrhage could also have this appearance. No focal nodules demonstrated in the right costophrenic angle region. No pleural effusions. No pneumothorax. Airways are patent. Upper Abdomen: Diffuse fatty infiltration of the liver. Surgical absence of  the gallbladder. Pneumobilia is likely postoperative. Spleen is mildly enlarged. Musculoskeletal: Mild degenerative  changes in the spine and at sternomanubrial joint. No destructive bone lesions. IMPRESSION: Patchy airspace infiltrates in the lungs suggesting multifocal pneumonia. Prominent coronary artery calcifications. Aortic atherosclerosis with suggestion of penetrating ulcers in the descending aorta. Fatty infiltration of the liver. Pneumobilia is likely postoperative. Electronically Signed   By: Lucienne Capers M.D.   On: 04/02/2016 05:52      Assessment/Plan 1. 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  There is clearly significant irregularity is seen in the descending thoracic aorta. This is interpreted as a penetrating ulcer and there may be a small penetrating ulcer, but there is significant mural thrombus and irregularity throughout much of the descending thoracic aorta. This is not aneurysmal or frankly stenotic at this point. He does not seem significantly symptomatic at this point. I have discussed the importance of lifestyle modifications such as smoking cessation, increasing exercise, healthy diet, and control of blood pressure. I have recommended that he remain on aspirin daily once his hemoptysis workup is concluded. This can be an 81 mg aspirin daily. He would likely benefit from a statin long-term as well. I will plan to see him back in the office in 1-2 months, and plan a follow-up CT scan sometime next year. No current intervention is required unless he developed significant back pain, chest pain, or abdominal pain that would be worrisome for extension of his penetrating ulcer or dissection. If these occur, I would recommend a repeat CT scan 2. Pneumonia. Likely the cause of his symptoms. Getting treatment with antibiotics. 3. Tobacco dependence. Smoking cessation strongly recommended. The dangers tobacco and the difficulty of  tobacco in the vascular system were discussed with the patient in detail. 4. Hypertension. Control important to avoid aneurysmal degeneration. Stable on outpatient medications.   Leotis Pain, MD  04/02/2016 2:25 PM    This note was created with Dragon medical transcription system.  Any error is purely unintentional

## 2016-04-02 NOTE — ED Triage Notes (Signed)
Pt states that he started after he was smoking a cigarette and became worse when he smoked a "joint" later. Per EMS, pt has been out of BP medicine for about a week and BP is 173/86 with PR of 76. Pt is alert and oriented, ambulatory with NAD noted at this time.

## 2016-04-02 NOTE — H&P (Signed)
[facility] [place] NAME: Victor Chapman    MR#:  [id number]  DATE OF BIRTH:  Nov 25, 1950  DATE OF ADMISSION:  04/02/2016  PRIMARY CARE PHYSICIAN: No PCP Per Patient   REQUESTING/REFERRING PHYSICIAN: Loney Hering, MD  CHIEF COMPLAINT:   Chief Complaint  Patient presents with  . Hematemesis   Cough and hemoptysis since last night. HISTORY OF PRESENT ILLNESS:  Victor Chapman  is a 65 y.o. male with a known history of HTN, COPD, CHF and cancer. The patient complained of cough and spitted up blood since last night. He said that he had smoked marijuana when suggested started yesterday. He also has fever,  chills and mild shortness of breath. The patient reported that he stopped taking his medication about 1 week ago. Chest x-ray didn't show any acute cardiopulmonary disease the CAT scan show multifocal pneumonia and pain is treated ulcers in the descending aorta. PAST MEDICAL HISTORY:   Past Medical History:  Diagnosis Date  . Cancer ([place])    skin  . CHF (congestive heart failure) ([place])   . COPD (chronic obstructive pulmonary disease) ([place])   . Hypertension     PAST SURGICAL HISTORY:   Past Surgical History:  Procedure Laterality Date  . ABDOMINAL SURGERY    . APPENDECTOMY     Part   . CARDIAC SURGERY    . CHOLECYSTECTOMY    . COLON SURGERY    . LIVER SURGERY     Part removed    SOCIAL HISTORY:   Social History  Substance Use Topics  . Smoking status: Current Every Day Smoker    Packs/day: 1.00    Years: 50.00  . Smokeless tobacco: Never Used  . Alcohol use No    FAMILY HISTORY:   Family History  Problem Relation Age of Onset  . Heart disease Mother     DRUG ALLERGIES:  No Known Allergies  REVIEW OF SYSTEMS:   Review of Systems  Constitutional: Positive for chills, fever and malaise/fatigue. Negative for weight loss.  HENT: Negative for sore throat.   Eyes: Negative for blurred vision and double vision.    Respiratory: Positive for cough, hemoptysis and shortness of breath. Negative for wheezing and stridor.   Cardiovascular: Negative for chest pain, palpitations and leg swelling.  Gastrointestinal: Negative for abdominal pain, blood in stool, diarrhea, melena, nausea and vomiting.  Genitourinary: Negative for dysuria, hematuria and urgency.  Musculoskeletal: Negative for joint pain.  Skin: Negative for itching and rash.  Neurological: Negative for dizziness, tingling, seizures, loss of consciousness and headaches.  Psychiatric/Behavioral: Negative for depression. The patient is not nervous/anxious.     MEDICATIONS AT HOME:   Prior to Admission medications   Medication Sig Start Date End Date Taking? Authorizing Provider  albuterol (PROVENTIL HFA;VENTOLIN HFA) 108 (90 Base) MCG/ACT inhaler Inhale into the lungs every 6 (six) hours as needed for wheezing or shortness of breath.   Yes Historical Provider, MD  ARIPiprazole (ABILIFY) 5 MG tablet Take 5 mg by mouth daily.   Yes Historical Provider, MD  fluticasone (FLONASE) 50 MCG/ACT nasal spray Place into both nostrils daily.   Yes Historical Provider, MD  gabapentin (NEURONTIN) 800 MG tablet Take 800 mg by mouth 3 (three) times daily.   Yes Historical Provider, MD  hydrOXYzine (ATARAX/VISTARIL) 25 MG tablet Take 25 mg by mouth 3 (three) times daily as needed.   Yes Historical Provider, MD  levocetirizine (XYZAL) 5 MG tablet Take 5 mg  by mouth every evening.   Yes Historical Provider, MD  nitroGLYCERIN (NITROSTAT) 0.4 MG SL tablet Place 0.4 mg under the tongue every 5 (five) minutes as needed for chest pain.   Yes Historical Provider, MD  ondansetron (ZOFRAN) 8 MG tablet Take by mouth every 12 (twelve) hours as needed for nausea or vomiting.   Yes Historical Provider, MD  oxybutynin (DITROPAN-XL) 5 MG 24 hr tablet Take 5 mg by mouth at bedtime.   Yes Historical Provider, MD  pravastatin (PRAVACHOL) 20 MG tablet Take 20 mg by mouth daily.   Yes  Historical Provider, MD  ranitidine (ZANTAC) 150 MG tablet Take 150 mg by mouth 2 (two) times daily.   Yes Historical Provider, MD  tiotropium (SPIRIVA) 18 MCG inhalation capsule Place 18 mcg into inhaler and inhale daily.   Yes Historical Provider, MD      VITAL SIGNS:  Blood pressure (!) 158/90, pulse 66, temperature 97.8 F (36.6 C), temperature source Oral, resp. rate (!) 29, height 5\' 9"  (1.753 m), weight 189 lb 3 oz (85.8 kg), SpO2 98 %.  PHYSICAL EXAMINATION:  Physical Exam  GENERAL:  65 y.o.-year-old patient lying in the bed with no acute distress.  EYES: Pupils equal, round, reactive to light and accommodation. No scleral icterus. Extraocular muscles intact.  HEENT: Head atraumatic, normocephalic. Oropharynx and nasopharynx clear.  NECK:  Supple, no jugular venous distention. No thyroid enlargement, no tenderness.  LUNGS: Normal breath sounds bilaterally, no wheezing, but has mild crackles bilaterally. No use of accessory muscles of respiration.  CARDIOVASCULAR: S1, S2 normal. No murmurs, rubs, or gallops.  ABDOMEN: Soft, nontender, nondistended. Bowel sounds present. No organomegaly or mass.  EXTREMITIES: No pedal edema, cyanosis, or clubbing.  NEUROLOGIC: Cranial nerves II through XII are intact. Muscle strength 5/5 in all extremities. Sensation intact. Gait not checked.  PSYCHIATRIC: The patient is alert and oriented x 3.  SKIN: No obvious rash, lesion, or ulcer.   LABORATORY PANEL:   CBC  Recent Labs Lab 04/02/16 0156  WBC 6.1  HGB 14.7  HCT 41.3  PLT 119*   ------------------------------------------------------------------------------------------------------------------  Chemistries   Recent Labs Lab 04/02/16 0156  NA 129*  K 3.4*  CL 97*  CO2 26  GLUCOSE 112*  BUN 7  CREATININE 0.75  CALCIUM 8.5*  AST 22  ALT 19  ALKPHOS 76  BILITOT 1.3*    ------------------------------------------------------------------------------------------------------------------  Cardiac Enzymes  Recent Labs Lab 04/02/16 0156  TROPONINI <0.03   ------------------------------------------------------------------------------------------------------------------  RADIOLOGY:  Dg Chest 2 View  Result Date: 04/02/2016 CLINICAL DATA:  65 y/o  M; 2 days of coughing up blood. EXAM: CHEST  2 VIEW COMPARISON:  None. FINDINGS: Normal cardiomediastinal silhouette. Nodular density in the right costodiaphragmatic recess measuring 15 mm. No focal consolidation. No pleural effusion or pneumothorax. Mild multilevel degenerative changes of the spine. IMPRESSION: No active cardiopulmonary disease. Nodular density projecting over right costodiaphragmatic recess of uncertain significance, consider CT of the chest for further characterization. Electronically Signed   By: Kristine Garbe M.D.   On: 04/02/2016 04:01   Ct Chest W Contrast  Result Date: 04/02/2016 CLINICAL DATA:  Hemoptysis beginning after smoking.  History of CHF. EXAM: CT CHEST WITH CONTRAST TECHNIQUE: Multidetector CT imaging of the chest was performed during intravenous contrast administration. CONTRAST:  76mL ISOVUE-300 IOPAMIDOL (ISOVUE-300) INJECTION 61% COMPARISON:  Chest radiograph 04/02/2016 FINDINGS: Cardiovascular: Normal heart size. Prominent coronary artery calcifications. Normal caliber thoracic aorta with calcific and noncalcific plaque formation. Suggestion of penetrating ulcers in the lower  descending thoracic aorta. No dissection. Great vessel origins are patent. Mediastinum/Nodes: No enlarged mediastinal, hilar, or axillary lymph nodes. Thyroid gland, trachea, and esophagus demonstrate no significant findings. Lungs/Pleura: Multiple patchy focal areas of airspace infiltration in both lungs likely to represent multifocal pneumonia. In the setting of hemoptysis, parenchymal hemorrhage could  also have this appearance. No focal nodules demonstrated in the right costophrenic angle region. No pleural effusions. No pneumothorax. Airways are patent. Upper Abdomen: Diffuse fatty infiltration of the liver. Surgical absence of the gallbladder. Pneumobilia is likely postoperative. Spleen is mildly enlarged. Musculoskeletal: Mild degenerative changes in the spine and at sternomanubrial joint. No destructive bone lesions. IMPRESSION: Patchy airspace infiltrates in the lungs suggesting multifocal pneumonia. Prominent coronary artery calcifications. Aortic atherosclerosis with suggestion of penetrating ulcers in the descending aorta. Fatty infiltration of the liver. Pneumobilia is likely postoperative. Electronically Signed   By: Lucienne Capers M.D.   On: 04/02/2016 05:52      IMPRESSION AND PLAN:   Bilateral multi-focal PNA, CAP Admitted to medical floor . Continue Zithromax and Rocephin, follow-up cultures and CBC. Start DuoNeb and Robitussin when necessary.  Hemoptysis. Possible due to pneumonia.  Hyponatremia. Start normal saline IV and follow-up BMP. Hypokalemia. Give potassium supplement, follow-up potassium and magnesium level.  Aortic atherosclerosis with suggestion of penetrating ulcers in the descending aorta. Muscular surgery consult.  Thrombocytopenia. Unclear itiology, follow-up CBC.  COPD. Stable, Continue nebulizer when necessary.  Fatty liver with mild elevated bilirubin. Follow-up as outpatient.  Tobacco abuse. Smoking cessation was counseled for 3-4 minutes, indicating patch. Substance abuse, using marijuana. Counseling was done.  All the records are reviewed and case discussed with ED provider. Management plans discussed with the patient, family and they are in agreement.  CODE STATUS: Full code  TOTAL TIME TAKING CARE OF THIS PATIENT: 56 minutes.    Demetrios Loll M.D on 04/02/2016 at 1:42 PM  Between 7am to 6pm - Pager - [phone]  After 6pm go to  [email or web address] - [employer] [facility]  Office  [phone]  CC: Primary care physician; No PCP Per Patient   Note: This dictation was prepared with Dragon dictation along with smaller phrase technology. Any transcriptional errors that result from this process are unintentional.

## 2016-04-02 NOTE — ED Provider Notes (Signed)
[facility] Emergency Department Provider Note   ____________________________________________   First MD Initiated Contact with Patient 04/02/16 0250     (approximate)  I have reviewed the triage vital signs and the nursing notes.   HISTORY  Chief Complaint Hematemesis    HPI Victor Chapman is a 65 y.o. male who comes into the hospital today spitting up blood. He reports that started last night. It got better and went away but then it started back. He reports that he had smoked marijuana when it started yesterday but today he was only smoking cigarettes.The patient reports that his friend told him to come into the hospital to get checked out. He reports that he is coughing it up and he is not vomiting. He said it doesn't bother him until he coughs. The patient denies any shortness of breath, chest pain, headache, dizziness. He took 6 BC powders yesterday over the day for a couple of days. This has never occurred before. The patient reports that he stopped taking his medications about a week ago. The patient is here for evaluation of his symptoms.   Past Medical History:  Diagnosis Date  . Cancer ([place])    skin  . CHF (congestive heart failure) ([place])   . COPD (chronic obstructive pulmonary disease) ([place])   . Hypertension     There are no active problems to display for this patient.   Past Surgical History:  Procedure Laterality Date  . ABDOMINAL SURGERY    . APPENDECTOMY     Part   . CARDIAC SURGERY    . CHOLECYSTECTOMY    . COLON SURGERY    . LIVER SURGERY     Part removed    Prior to Admission medications   Not on File    Allergies Review of patient's allergies indicates no known allergies.  No family history on file.  Social History Social History  Substance Use Topics  . Smoking status: Current Every Day Smoker    Packs/day: 1.00  . Smokeless tobacco: Never Used  . Alcohol use No    Review of Systems Constitutional: No  fever/chills Eyes: No visual changes. ENT: No sore throat. Cardiovascular: Denies chest pain. Respiratory: Cough and a blood Gastrointestinal: No abdominal pain.  No nausea, no vomiting.  No diarrhea.  No constipation. Genitourinary: Negative for dysuria. Musculoskeletal: Negative for back pain. Skin: Negative for rash. Neurological: Negative for headaches, focal weakness or numbness.  10-point ROS otherwise negative.  ____________________________________________   PHYSICAL EXAM:  VITAL SIGNS: ED Triage Vitals  Enc Vitals Group     BP 04/02/16 0151 (!) 168/94     Pulse Rate 04/02/16 0151 72     Resp 04/02/16 0151 18     Temp 04/02/16 0151 98 F (36.7 C)     Temp Source 04/02/16 0151 Oral     SpO2 04/02/16 0151 96 %     Weight 04/02/16 0143 190 lb (86.2 kg)     Height 04/02/16 0143 5\' 9"  (1.753 m)     Head Circumference --      Peak Flow --      Pain Score 04/02/16 0143 4     Pain Loc --      Pain Edu? --      Excl. in [place]? --     Constitutional: Alert and oriented. Well appearing and in no acute distress. Eyes: Conjunctivae are normal. PERRL. EOMI. Head: Atraumatic. Nose: No congestion/rhinnorhea. Mouth/Throat: Mucous membranes are moist.  Oropharynx non-erythematous. Cardiovascular:  Normal rate, regular rhythm. Grossly normal heart sounds.  Good peripheral circulation. Respiratory: Normal respiratory effort.  No retractions. Coarse expiratory rhonchi in all lung fields.. Gastrointestinal: Soft and nontender. No distention.  Musculoskeletal: No lower extremity tenderness nor edema.   Neurologic:  Normal speech and language.  Skin:  Skin is warm, dry and intact.  Psychiatric: Mood and affect are normal.   ____________________________________________   LABS (all labs ordered are listed, but only abnormal results are displayed)  Labs Reviewed  COMPREHENSIVE METABOLIC PANEL - Abnormal; Notable for the following:       Result Value   Sodium 129 (*)    Potassium  3.4 (*)    Chloride 97 (*)    Glucose, Bld 112 (*)    Calcium 8.5 (*)    Total Bilirubin 1.3 (*)    All other components within normal limits  CBC - Abnormal; Notable for the following:    RBC 4.25 (*)    MCH 34.7 (*)    Platelets 119 (*)    All other components within normal limits  BLOOD GAS, VENOUS - Abnormal; Notable for the following:    pCO2, Ven 41 (*)    pO2, Ven 49.0 (*)    All other components within normal limits  CULTURE, BLOOD (ROUTINE X 2)  CULTURE, BLOOD (ROUTINE X 2)  TROPONIN I  TYPE AND SCREEN  TYPE AND SCREEN   ____________________________________________  EKG  ED ECG REPORT I, Loney Hering, the attending physician, personally viewed and interpreted this ECG.   Date: 04/02/2016  EKG Time: 204  Rate: 63  Rhythm: normal sinus rhythm with come PVCs  Axis: normal  Intervals:none  ST&T Change: flipped T waves in leads 3, aVF, V6  ____________________________________________  RADIOLOGY  CXR CT chest ____________________________________________   PROCEDURES  Procedure(s) performed: None  Procedures  Critical Care performed: No  ____________________________________________   INITIAL IMPRESSION / ASSESSMENT AND PLAN / ED COURSE  Pertinent labs & imaging results that were available during my care of the patient were reviewed by me and considered in my medical decision making (see chart for details).  This is a 65 year old male who comes into the hospital today with some coughing up blood. The patient was concerned about this coughing up blood since it occurred multiple times. Patient's blood work is unremarkable at this time and he did receive a chest x-ray. He does continue to have hemoptysis in the emergency department. I will continue to evaluate the patient and monitor his vital signs.  Clinical Course  Value Comment By Time  DG Chest 2 View No active cardiopulmonary disease. Nodular density projecting over right costodiaphragmatic  recess of uncertain significance, consider CT of the chest for further characterization.   Loney Hering, MD 10/02 [phone]  CT Chest W Contrast Patchy airspace infiltrates in the lungs suggesting multifocal pneumonia. Prominent coronary artery calcifications. Aortic atherosclerosis with suggestion of penetrating ulcers in the descending aorta. Fatty infiltration of the liver. Pneumobilia is likely postoperative.   Loney Hering, MD 10/02 [phone]   The patient's CT scan shows some patchy airspace infiltrates with a concern for multifocal pneumonia. I will admit the patient to the hospitalist service for IV antibiotics. He did receive some ceftriaxone and azithromycin while in the emergency department.  ____________________________________________   FINAL CLINICAL IMPRESSION(S) / ED DIAGNOSES  Final diagnoses:  Hemoptysis  Multifocal pneumonia      NEW MEDICATIONS STARTED DURING THIS VISIT:  New Prescriptions   No medications on  file     Note:  This document was prepared using Dragon voice recognition software and may include unintentional dictation errors.    Loney Hering, MD 04/02/16 [phone]

## 2016-04-03 LAB — CBC
HCT: 38.8 % — ABNORMAL LOW (ref 40.0–52.0)
Hemoglobin: 13.6 g/dL (ref 13.0–18.0)
MCH: 35 pg — AB (ref 26.0–34.0)
MCHC: 35.2 g/dL (ref 32.0–36.0)
MCV: 99.5 fL (ref 80.0–100.0)
PLATELETS: 98 10*3/uL — AB (ref 150–440)
RBC: 3.9 MIL/uL — ABNORMAL LOW (ref 4.40–5.90)
RDW: 14 % (ref 11.5–14.5)
WBC: 3.6 10*3/uL — ABNORMAL LOW (ref 3.8–10.6)

## 2016-04-03 LAB — HIV ANTIBODY (ROUTINE TESTING W REFLEX): HIV Screen 4th Generation wRfx: NONREACTIVE

## 2016-04-03 LAB — BASIC METABOLIC PANEL
Anion gap: 3 — ABNORMAL LOW (ref 5–15)
BUN: 5 mg/dL — ABNORMAL LOW (ref 6–20)
CALCIUM: 8.2 mg/dL — AB (ref 8.9–10.3)
CHLORIDE: 105 mmol/L (ref 101–111)
CO2: 27 mmol/L (ref 22–32)
Creatinine, Ser: 0.72 mg/dL (ref 0.61–1.24)
GFR calc non Af Amer: >60 mL/min (ref >60)
GLUCOSE: 109 mg/dL — AB (ref 65–99)
POTASSIUM: 3.7 mmol/L (ref 3.5–5.1)
SODIUM: 135 mmol/L (ref 135–145)

## 2016-04-03 LAB — LIPID PANEL
CHOLESTEROL: 121 mg/dL (ref 0–200)
HDL: 25 mg/dL — ABNORMAL LOW (ref >40)
LDL Cholesterol: 70 mg/dL (ref 0–99)
Total CHOL/HDL Ratio: 4.8 RATIO
Triglycerides: 132 mg/dL (ref <150)
VLDL: 26 mg/dL (ref 0–40)

## 2016-04-03 MED ORDER — MAGNESIUM SULFATE 2 GM/50ML IV SOLN
2.0000 g | Freq: Once | INTRAVENOUS | Status: AC
  Administered 2016-04-03: 2 g via INTRAVENOUS
  Filled 2016-04-03: qty 50

## 2016-04-03 MED ORDER — ASPIRIN 81 MG PO CHEW
81.0000 mg | CHEWABLE_TABLET | Freq: Every day | ORAL | Status: DC
  Administered 2016-04-03 – 2016-04-04 (×2): 81 mg via ORAL
  Filled 2016-04-03 (×2): qty 1

## 2016-04-03 NOTE — Progress Notes (Signed)
[facility] at [place] NAME: Victor Chapman    MR#:  [id number]  DATE OF BIRTH:  02-10-1951  SUBJECTIVE:  CHIEF COMPLAINT:   Chief Complaint  Patient presents with  . Hematemesis   Cough, feels better, no hemoptysis. REVIEW OF SYSTEMS:  Review of Systems  Constitutional: Negative for chills, fever and malaise/fatigue.  HENT: Negative for sore throat.   Eyes: Negative for blurred vision and double vision.  Respiratory: Positive for cough and wheezing. Negative for hemoptysis, shortness of breath and stridor.   Cardiovascular: Negative for chest pain and leg swelling.  Gastrointestinal: Negative for abdominal pain, blood in stool, diarrhea, melena, nausea and vomiting.  Genitourinary: Negative for dysuria, frequency and urgency.  Musculoskeletal: Negative for joint pain.  Neurological: Negative for dizziness, tingling, seizures, loss of consciousness and headaches.  Psychiatric/Behavioral: Negative for depression. The patient is not nervous/anxious.     DRUG ALLERGIES:  No Known Allergies VITALS:  Blood pressure 129/70, pulse 62, temperature 97.5 F (36.4 C), temperature source Oral, resp. rate 20, height 5\' 9"  (1.753 m), weight 189 lb 3 oz (85.8 kg), SpO2 98 %. PHYSICAL EXAMINATION:  Physical Exam  Constitutional: He is oriented to person, place, and time and well-developed, well-nourished, and in no distress. No distress.  HENT:  Head: Normocephalic.  Mouth/Throat: Oropharynx is clear and moist.  Eyes: Conjunctivae and EOM are normal. No scleral icterus.  Neck: Normal range of motion. Neck supple. No JVD present.  Cardiovascular: Normal rate, regular rhythm and normal heart sounds.  Exam reveals no gallop.   No murmur heard. Pulmonary/Chest: Effort normal and breath sounds normal. No respiratory distress. He has no wheezes.  Abdominal: Soft. Bowel sounds are normal. He exhibits no distension. There is no tenderness.    Musculoskeletal: Normal range of motion. He exhibits no edema or tenderness.  Lymphadenopathy:    He has no cervical adenopathy.  Neurological: He is alert and oriented to person, place, and time. No cranial nerve deficit.  Skin: Skin is warm.  Psychiatric: Mood, memory, affect and judgment normal.   LABORATORY PANEL:   CBC  Recent Labs Lab 04/03/16 0344  WBC 3.6*  HGB 13.6  HCT 38.8*  PLT 98*   ------------------------------------------------------------------------------------------------------------------ Chemistries   Recent Labs Lab 04/02/16 0156 04/02/16 1457 04/03/16 0344  NA 129*  --  135  K 3.4*  --  3.7  CL 97*  --  105  CO2 26  --  27  GLUCOSE 112*  --  109*  BUN 7  --  5*  CREATININE 0.75  --  0.72  CALCIUM 8.5*  --  8.2*  MG  --  1.6*  --   AST 22  --   --   ALT 19  --   --   ALKPHOS 76  --   --   BILITOT 1.3*  --   --    RADIOLOGY:  No results found. ASSESSMENT AND PLAN:   Bilateral multi-focal PNA, CAP Continue Zithromax and Rocephin, follow-up cultures. continue DuoNeb and Robitussin when necessary.  Hemoptysis. Possible due to pneumonia.  Hyponatremia. Improved with normal saline IV. Hypokalemia. Improved with potassium supplement. Hypomagnesemia, given mg iv, f/u  magnesium level.  Aortic atherosclerosis with suggestion of penetrating ulcers in the descending aorta. No intervention at this time, aspirin and statin daily per Dr. Lucky Cowboy. F/u as outpatient.  Thrombocytopenia. Unclear itiology, follow-up CBC.  COPD. Stable, Continue nebulizer when necessary.  Fatty liver with mild elevated bilirubin.  Follow-up as outpatient.  Tobacco abuse. Smoking cessation was counseled for 3-4 minutes, indicating patch. Substance abuse, using marijuana. Counseling was done.   All the records are reviewed and case discussed with Care Management/Social Worker. Management plans discussed with the patient, family and they are in agreement.  CODE  STATUS: Full code  TOTAL TIME TAKING CARE OF THIS PATIENT: 28 minutes.   More than 50% of the time was spent in counseling/coordination of care: YES  POSSIBLE D/C IN 1 DAYS, DEPENDING ON CLINICAL CONDITION.   Demetrios Loll M.D on 04/03/2016 at 2:43 PM  Between 7am to 6pm - Pager - [phone]  After 6pm go to [email or web address] - [employer] [place] [facility]  Office  [phone]  CC: Primary care physician; No PCP Per Patient  Note: This dictation was prepared with Dragon dictation along with smaller phrase technology. Any transcriptional errors that result from this process are unintentional.

## 2016-04-03 NOTE — Care Management Important Message (Signed)
Important Message  Patient Details  Name: Victor Chapman MRN: [id number] Date of Birth: 08/05/1950   Medicare Important Message Given:  Yes    Shelbie Ammons, RN 04/03/2016, 11:23 AM

## 2016-04-04 LAB — CULTURE, RESPIRATORY W GRAM STAIN: Culture: NORMAL

## 2016-04-04 LAB — CULTURE, RESPIRATORY

## 2016-04-04 MED ORDER — PREDNISONE 5 MG PO TABS: ORAL_TABLET | ORAL | 0 refills | Status: AC

## 2016-04-04 MED ORDER — OXYBUTYNIN CHLORIDE ER 5 MG PO TB24: 5.0000 mg | ORAL_TABLET | Freq: Every day | ORAL | 0 refills | Status: AC

## 2016-04-04 MED ORDER — CEFUROXIME AXETIL 500 MG PO TABS: 500.0000 mg | ORAL_TABLET | Freq: Two times a day (BID) | ORAL | 0 refills | Status: AC

## 2016-04-04 MED ORDER — LEVOCETIRIZINE DIHYDROCHLORIDE 5 MG PO TABS: 5.0000 mg | ORAL_TABLET | Freq: Every evening | ORAL | 0 refills | Status: AC

## 2016-04-04 MED ORDER — ARIPIPRAZOLE 5 MG PO TABS: 5.0000 mg | ORAL_TABLET | Freq: Every day | ORAL | 0 refills | Status: AC

## 2016-04-04 MED ORDER — NITROGLYCERIN 0.4 MG SL SUBL: 0.4000 mg | SUBLINGUAL_TABLET | SUBLINGUAL | 0 refills | Status: AC | PRN

## 2016-04-04 MED ORDER — ALBUTEROL SULFATE HFA 108 (90 BASE) MCG/ACT IN AERS: 2.0000 | INHALATION_SPRAY | Freq: Four times a day (QID) | RESPIRATORY_TRACT | 0 refills | Status: AC | PRN

## 2016-04-04 MED ORDER — FLUTICASONE PROPIONATE 50 MCG/ACT NA SUSP: 1.0000 | Freq: Every day | NASAL | 0 refills | Status: AC

## 2016-04-04 MED ORDER — ASPIRIN 81 MG PO CHEW: 81.0000 mg | CHEWABLE_TABLET | Freq: Every day | ORAL | 0 refills | Status: AC

## 2016-04-04 MED ORDER — ONDANSETRON HCL 8 MG PO TABS: 8.0000 mg | ORAL_TABLET | Freq: Two times a day (BID) | ORAL | 0 refills | Status: AC | PRN

## 2016-04-04 MED ORDER — NICOTINE 14 MG/24HR TD PT24: 14.0000 mg | MEDICATED_PATCH | Freq: Every day | TRANSDERMAL | 0 refills | Status: AC

## 2016-04-04 MED ORDER — PRAVASTATIN SODIUM 20 MG PO TABS: 20.0000 mg | ORAL_TABLET | Freq: Every day | ORAL | 0 refills | Status: AC

## 2016-04-04 MED ORDER — HYDROXYZINE HCL 25 MG PO TABS: 25.0000 mg | ORAL_TABLET | Freq: Three times a day (TID) | ORAL | 0 refills | Status: AC | PRN

## 2016-04-04 MED ORDER — MOMETASONE FURO-FORMOTEROL FUM 100-5 MCG/ACT IN AERO: 2.0000 | INHALATION_SPRAY | Freq: Two times a day (BID) | RESPIRATORY_TRACT | 0 refills | Status: AC

## 2016-04-04 MED ORDER — RANITIDINE HCL 150 MG PO TABS: 150.0000 mg | ORAL_TABLET | Freq: Two times a day (BID) | ORAL | 0 refills | Status: AC

## 2016-04-04 MED ORDER — TIOTROPIUM BROMIDE MONOHYDRATE 18 MCG IN CAPS: 18.0000 ug | ORAL_CAPSULE | Freq: Every day | RESPIRATORY_TRACT | 0 refills | Status: AC

## 2016-04-04 MED ORDER — GABAPENTIN 800 MG PO TABS: 800.0000 mg | ORAL_TABLET | Freq: Three times a day (TID) | ORAL | 0 refills | Status: AC

## 2016-04-04 NOTE — Discharge Summary (Signed)
[place] [facility] at [place] NAME: Victor Chapman    MR#:  [id number]  DATE OF BIRTH:  [birth year]  DATE OF ADMISSION:  04/02/2016 ADMITTING PHYSICIAN: Demetrios Loll, MD  DATE OF DISCHARGE: 04/04/2016  1:12 PM  PRIMARY CARE PHYSICIAN: No PCP Per Patient    ADMISSION DIAGNOSIS:  Hemoptysis [R04.2] Multifocal pneumonia [J18.9]  DISCHARGE DIAGNOSIS:  Active Problems:   Pneumonia   SECONDARY DIAGNOSIS:   Past Medical History:  Diagnosis Date  . Cancer ([place])    skin  . CHF (congestive heart failure) ([place])   . COPD (chronic obstructive pulmonary disease) ([place])   . Hypertension     HOSPITAL COURSE:   1. Pneumonia with hemoptysis. Patient was on Rocephin and Zithromax. He received 3 days of IV Zithromax high-dose while here which would complete a course. Switch Rocephin over to Ceftin upon completion. 2. COPD exacerbation. Taper prednisone off quickly. Scripts written for his inhalers. 3. Hypokalemia hypomagnesemia and hyponatremia improved with supplementation and IV fluids. 4. Aortic atherosclerosis with suggestion of penetrating ulcers of the descending aorta. Seen in consultation by Dr. dew he recommended aspirin and statin. 5. Thrombocytopenia. Follow-up as outpatient. 6. Hemoptysis follow-up with pulmonary as outpatient. Likely this is secondary to pneumonia. CT scan did not show any mass. 7. Tobacco abuse nicotine patch prescribed. Patient is not sure if he will quit smoking.  DISCHARGE CONDITIONS:   Satisfactory  CONSULTS OBTAINED:  Treatment Team:  Algernon Huxley, MD  DRUG ALLERGIES:  No Known Allergies  DISCHARGE MEDICATIONS:   Discharge Medication List as of 04/04/2016  9:47 AM    START taking these medications   Details  aspirin 81 MG chewable tablet Chew 1 tablet (81 mg total) by mouth daily., Starting Thu 04/05/2016, Print    cefUROXime (CEFTIN) 500 MG tablet Take 1 tablet (500 mg total) by mouth 2 (two) times daily with a meal.,  Starting Wed 04/04/2016, Print    mometasone-formoterol (DULERA) 100-5 MCG/ACT AERO Inhale 2 puffs into the lungs 2 (two) times daily., Starting Wed 04/04/2016, Print    nicotine (NICODERM CQ - DOSED IN MG/24 HOURS) 14 mg/24hr patch Place 1 patch (14 mg total) onto the skin daily., Starting Thu 04/05/2016, Print    predniSONE (DELTASONE) 5 MG tablet One tab daily for two days, Print      CONTINUE these medications which have CHANGED   Details  albuterol (PROVENTIL HFA;VENTOLIN HFA) 108 (90 Base) MCG/ACT inhaler Inhale 2 puffs into the lungs every 6 (six) hours as needed for wheezing or shortness of breath., Starting Wed 04/04/2016, Print    ARIPiprazole (ABILIFY) 5 MG tablet Take 1 tablet (5 mg total) by mouth daily., Starting Wed 04/04/2016, Print    fluticasone (FLONASE) 50 MCG/ACT nasal spray Place 1 spray into both nostrils daily., Starting Wed 04/04/2016, Print    gabapentin (NEURONTIN) 800 MG tablet Take 1 tablet (800 mg total) by mouth 3 (three) times daily., Starting Wed 04/04/2016, Print    hydrOXYzine (ATARAX/VISTARIL) 25 MG tablet Take 1 tablet (25 mg total) by mouth 3 (three) times daily as needed., Starting Wed 04/04/2016, Print    levocetirizine (XYZAL) 5 MG tablet Take 1 tablet (5 mg total) by mouth every evening., Starting Wed 04/04/2016, Print    nitroGLYCERIN (NITROSTAT) 0.4 MG SL tablet Place 1 tablet (0.4 mg total) under the tongue every 5 (five) minutes as needed for chest pain., Starting Wed 04/04/2016, Print    ondansetron (ZOFRAN) 8 MG tablet Take 1 tablet (  8 mg total) by mouth every 12 (twelve) hours as needed for nausea or vomiting., Starting Wed 04/04/2016, Print    oxybutynin (DITROPAN-XL) 5 MG 24 hr tablet Take 1 tablet (5 mg total) by mouth at bedtime., Starting Wed 04/04/2016, Print    pravastatin (PRAVACHOL) 20 MG tablet Take 1 tablet (20 mg total) by mouth daily., Starting Wed 04/04/2016, Print    ranitidine (ZANTAC) 150 MG tablet Take 1 tablet (150 mg total) by  mouth 2 (two) times daily., Starting Wed 04/04/2016, Print    tiotropium (SPIRIVA) 18 MCG inhalation capsule Place 1 capsule (18 mcg total) into inhaler and inhale daily., Starting Wed 04/04/2016, Print         DISCHARGE INSTRUCTIONS:   Follow-up PMD in 1 week Follow-up with pulmonary 3 weeks Follow-up with vascular surgery as outpatient  If you experience worsening of your admission symptoms, develop shortness of breath, life threatening emergency, suicidal or homicidal thoughts you must seek medical attention immediately by calling 911 or calling your MD immediately  if symptoms less severe.  You Must read complete instructions/literature along with all the possible adverse reactions/side effects for all the Medicines you take and that have been prescribed to you. Take any new Medicines after you have completely understood and accept all the possible adverse reactions/side effects.   Please note  You were cared for by a hospitalist during your hospital stay. If you have any questions about your discharge medications or the care you received while you were in the hospital after you are discharged, you can call the unit and asked to speak with the hospitalist on call if the hospitalist that took care of you is not available. Once you are discharged, your primary care physician will handle any further medical issues. Please note that NO REFILLS for any discharge medications will be authorized once you are discharged, as it is imperative that you return to your primary care physician (or establish a relationship with a primary care physician if you do not have one) for your aftercare needs so that they can reassess your need for medications and monitor your lab values.    Today   CHIEF COMPLAINT:   Chief Complaint  Patient presents with  . Hematemesis    HISTORY OF PRESENT ILLNESS:  Victor Chapman  is a 65 y.o. male presented with hemoptysis and found to have pneumonia.   VITAL SIGNS:   Blood pressure 127/71, pulse 70, temperature 97.5 F (36.4 C), temperature source Oral, resp. rate 18, height 5\' 9"  (1.753 m), weight 85.8 kg (189 lb 3 oz), SpO2 100 %.  I/O:   Intake/Output Summary (Last 24 hours) at 04/04/16 1559 Last data filed at 04/04/16 0800  Gross per 24 hour  Intake              480 ml  Output                0 ml  Net              480 ml    PHYSICAL EXAMINATION:  GENERAL:  65 y.o.-year-old patient lying in the bed with no acute distress.  EYES: Pupils equal, round, reactive to light and accommodation. No scleral icterus. Extraocular muscles intact.  HEENT: Head atraumatic, normocephalic. Oropharynx and nasopharynx clear.  NECK:  Supple, no jugular venous distention. No thyroid enlargement, no tenderness.  LUNGS: Normal breath sounds bilaterally, no wheezing, rales,rhonchi or crepitation. No use of accessory muscles of respiration.  CARDIOVASCULAR: S1, S2 normal.  No murmurs, rubs, or gallops.  ABDOMEN: Soft, non-tender, non-distended. Bowel sounds present. No organomegaly or mass.  EXTREMITIES: No pedal edema, cyanosis, or clubbing.  NEUROLOGIC: Cranial nerves II through XII are intact. Muscle strength 5/5 in all extremities. Sensation intact. Gait not checked.  PSYCHIATRIC: The patient is alert and oriented x 3.  SKIN: No obvious rash, lesion, or ulcer.   DATA REVIEW:   CBC  Recent Labs Lab 04/03/16 0344  WBC 3.6*  HGB 13.6  HCT 38.8*  PLT 98*    Chemistries   Recent Labs Lab 04/02/16 0156 04/02/16 1457 04/03/16 0344  NA 129*  --  135  K 3.4*  --  3.7  CL 97*  --  105  CO2 26  --  27  GLUCOSE 112*  --  109*  BUN 7  --  5*  CREATININE 0.75  --  0.72  CALCIUM 8.5*  --  8.2*  MG  --  1.6*  --   AST 22  --   --   ALT 19  --   --   ALKPHOS 76  --   --   BILITOT 1.3*  --   --     Cardiac Enzymes  Recent Labs Lab 04/02/16 0156  TROPONINI <0.03    Microbiology Results  Results for orders placed or performed during the hospital  encounter of 04/02/16  CULTURE, BLOOD (ROUTINE X 2) w Reflex to ID Panel     Status: None (Preliminary result)   Collection Time: 04/02/16  8:32 AM  Result Value Ref Range Status   Specimen Description BLOOD RIGHT ARM  Final   Special Requests BOTTLES DRAWN AEROBIC AND ANAEROBIC 4CC  Final   Culture NO GROWTH 2 DAYS  Final   Report Status PENDING  Incomplete  CULTURE, BLOOD (ROUTINE X 2) w Reflex to ID Panel     Status: None (Preliminary result)   Collection Time: 04/02/16  8:33 AM  Result Value Ref Range Status   Specimen Description BLOOD LEFT AC  Final   Special Requests BOTTLES DRAWN AEROBIC AND ANAEROBIC 5CC  Final   Culture NO GROWTH 2 DAYS  Final   Report Status PENDING  Incomplete  MRSA PCR Screening     Status: None   Collection Time: 04/02/16 10:07 AM  Result Value Ref Range Status   MRSA by PCR NEGATIVE NEGATIVE Final    Comment:        The GeneXpert MRSA Assay (FDA approved for NASAL specimens only), is one component of a comprehensive MRSA colonization surveillance program. It is not intended to diagnose MRSA infection nor to guide or monitor treatment for MRSA infections.   Culture, sputum-assessment     Status: None   Collection Time: 04/02/16  2:10 PM  Result Value Ref Range Status   Specimen Description EXPECTORATED SPUTUM  Final   Special Requests NONE  Final   Sputum evaluation THIS SPECIMEN IS ACCEPTABLE FOR SPUTUM CULTURE  Final   Report Status 04/02/2016 FINAL  Final  Culture, respiratory (NON-Expectorated)     Status: None   Collection Time: 04/02/16  2:10 PM  Result Value Ref Range Status   Specimen Description EXPECTORATED SPUTUM  Final   Special Requests NONE Reflexed from [id number]  Final   Gram Stain   Final    FEW WBC PRESENT, PREDOMINANTLY PMN MODERATE GRAM POSITIVE COCCI IN PAIRS FEW GRAM NEGATIVE COCCOBACILLI FEW GRAM NEGATIVE DIPLOCOCCI    Culture   Final    Consistent with normal  respiratory flora. Performed at [facility]     Report Status 04/04/2016 FINAL  Final       Management plans discussed with the patient, family and they are in agreement.  CODE STATUS:     Code Status Orders        Start     Ordered   04/02/16 0939  Full code  Continuous     04/02/16 0938    Code Status History    Date Active Date Inactive Code Status Order ID Comments User Context   This patient has a current code status but no historical code status.      TOTAL TIME TAKING CARE OF THIS PATIENT: 35 minutes.    Loletha Grayer M.D on 04/04/2016 at 3:59 PM  Between 7am to 6pm - Pager - [phone]  After 6pm go to [email or web address] - password [email or web address]  [employer] Office  [phone]  CC: Primary care physician; No PCP Per Patient

## 2016-04-04 NOTE — Discharge Instructions (Signed)

## 2016-04-04 NOTE — Progress Notes (Signed)
Discharge information reviewed and prescriptions given to patient who verbalized understanding. Patient's friend to transport home.

## 2016-04-07 LAB — CULTURE, BLOOD (ROUTINE X 2)
CULTURE: NO GROWTH
Culture: NO GROWTH

## 2016-04-19 ENCOUNTER — Inpatient Hospital Stay: Payer: Medicare Other | Admitting: Internal Medicine

## 2016-04-26 ENCOUNTER — Encounter: Payer: Self-pay | Admitting: *Deleted

## 2016-04-26 ENCOUNTER — Inpatient Hospital Stay: Payer: Medicare Other | Admitting: Internal Medicine

## 2016-05-08 ENCOUNTER — Encounter (INDEPENDENT_AMBULATORY_CARE_PROVIDER_SITE_OTHER): Payer: Medicare Other | Admitting: Vascular Surgery

## 2016-06-07 DIAGNOSIS — F419 Anxiety disorder, unspecified: Secondary | ICD-10-CM

## 2016-06-07 DIAGNOSIS — E785 Hyperlipidemia, unspecified: Secondary | ICD-10-CM | POA: Insufficient documentation

## 2016-06-07 DIAGNOSIS — F319 Bipolar disorder, unspecified: Secondary | ICD-10-CM | POA: Insufficient documentation

## 2016-06-07 DIAGNOSIS — I10 Essential (primary) hypertension: Secondary | ICD-10-CM | POA: Insufficient documentation

## 2016-06-07 DIAGNOSIS — E782 Mixed hyperlipidemia: Secondary | ICD-10-CM

## 2016-06-07 DIAGNOSIS — N400 Enlarged prostate without lower urinary tract symptoms: Secondary | ICD-10-CM

## 2016-06-07 DIAGNOSIS — J449 Chronic obstructive pulmonary disease, unspecified: Secondary | ICD-10-CM | POA: Insufficient documentation

## 2016-06-07 DIAGNOSIS — I2510 Atherosclerotic heart disease of native coronary artery without angina pectoris: Secondary | ICD-10-CM | POA: Insufficient documentation

## 2016-07-10 ENCOUNTER — Ambulatory Visit: Payer: Self-pay | Admitting: Family Medicine

## 2016-11-04 ENCOUNTER — Emergency Department: Payer: Medicare Other

## 2016-11-04 ENCOUNTER — Emergency Department
Admission: EM | Admit: 2016-11-04 | Discharge: 2016-11-04 | Disposition: A | Discharge status: Home / Self Care | Payer: Medicare Other | Attending: Emergency Medicine | Admitting: Emergency Medicine

## 2016-11-04 ENCOUNTER — Encounter: Payer: Self-pay | Admitting: Emergency Medicine

## 2016-11-04 DIAGNOSIS — J189 Pneumonia, unspecified organism: Secondary | ICD-10-CM | POA: Diagnosis not present

## 2016-11-04 DIAGNOSIS — F17200 Nicotine dependence, unspecified, uncomplicated: Secondary | ICD-10-CM | POA: Insufficient documentation

## 2016-11-04 DIAGNOSIS — I2510 Atherosclerotic heart disease of native coronary artery without angina pectoris: Secondary | ICD-10-CM | POA: Insufficient documentation

## 2016-11-04 DIAGNOSIS — I509 Heart failure, unspecified: Secondary | ICD-10-CM | POA: Insufficient documentation

## 2016-11-04 DIAGNOSIS — R079 Chest pain, unspecified: Secondary | ICD-10-CM

## 2016-11-04 DIAGNOSIS — I110 Hypertensive heart disease with heart failure: Secondary | ICD-10-CM | POA: Diagnosis not present

## 2016-11-04 DIAGNOSIS — Z7982 Long term (current) use of aspirin: Secondary | ICD-10-CM | POA: Diagnosis not present

## 2016-11-04 LAB — BASIC METABOLIC PANEL
ANION GAP: 8 (ref 5–15)
BUN: 9 mg/dL (ref 6–20)
CALCIUM: 8.3 mg/dL — AB (ref 8.9–10.3)
CO2: 26 mmol/L (ref 22–32)
Chloride: 97 mmol/L — ABNORMAL LOW (ref 101–111)
Creatinine, Ser: 1.03 mg/dL (ref 0.61–1.24)
Glucose, Bld: 237 mg/dL — ABNORMAL HIGH (ref 65–99)
Potassium: 3.6 mmol/L (ref 3.5–5.1)
Sodium: 131 mmol/L — ABNORMAL LOW (ref 135–145)

## 2016-11-04 LAB — CBC
HCT: 37.3 % — ABNORMAL LOW (ref 40.0–52.0)
HEMOGLOBIN: 13 g/dL (ref 13.0–18.0)
MCH: 32 pg (ref 26.0–34.0)
MCHC: 34.9 g/dL (ref 32.0–36.0)
MCV: 91.6 fL (ref 80.0–100.0)
Platelets: 113 10*3/uL — ABNORMAL LOW (ref 150–440)
RBC: 4.08 MIL/uL — AB (ref 4.40–5.90)
RDW: 13.8 % (ref 11.5–14.5)
WBC: 5.5 10*3/uL (ref 3.8–10.6)

## 2016-11-04 LAB — TROPONIN I

## 2016-11-04 MED ORDER — LEVOFLOXACIN 750 MG PO TABS: 750.0000 mg | ORAL_TABLET | Freq: Every day | ORAL | 0 refills | Status: AC

## 2016-11-04 NOTE — ED Notes (Signed)
Pt given water. Informed of plan per dr Archie Balboa.

## 2016-11-04 NOTE — Discharge Instructions (Signed)
Please seek medical attention for any high fevers, chest pain, shortness of breath, change in behavior, persistent vomiting, bloody stool or any other new or concerning symptoms.  

## 2016-11-04 NOTE — ED Notes (Signed)
Pt denies needs at this time. Pt updated on delay for results. Pt verbalizes understanding. Call bell at side.

## 2016-11-04 NOTE — ED Notes (Signed)
Report from valerie, rn.  

## 2016-11-04 NOTE — ED Notes (Signed)
Pt remains alert and oriented. NAD. No needs at this time.

## 2016-11-04 NOTE — ED Triage Notes (Signed)
Pt c/o diffuse chest pain for 3 days intermittent. Hx 7 stents. Has had some sweating per pt. No distress currently.

## 2016-11-04 NOTE — ED Notes (Signed)
Patient transported to X-ray 

## 2016-11-04 NOTE — ED Provider Notes (Signed)
[facility] Emergency Department Provider Note   ____________________________________________   I have reviewed the triage vital signs and the nursing notes.   HISTORY  Chief Complaint Chest Pain   History limited by: Not Limited   HPI Victor Chapman is a 66 y.o. male who presents to the emergency department today because of concern for chest pain. The patient states that the pain is located across his central frontal chest. The pain started three days ago. The patient states that it has been constant but getting gradually worse. Today it got severe enough he called 911. The patient describes it as burning. He has a history of CAD and stent placement. Was at [facility] roughly 1 week ago and had negative serial troponins.   Past Medical History:  Diagnosis Date  . Alcohol dependence in remission ([place])   . Anxiety   . Arthritis   . Bipolar disorder ([place])   . BPH (benign prostatic hyperplasia)   . CAD (coronary artery disease)   . Cancer ([place])    skin  . CHF (congestive heart failure) ([place])   . COPD (chronic obstructive pulmonary disease) ([place])   . Eccrine porocarcinoma of skin    left foot  . Head injury due to trauma 10/2014  . Hyperlipidemia   . Hypertension   . Myocardial infarction ([place])   . Nicotine dependence     Patient Active Problem List   Diagnosis Date Noted  . COPD (chronic obstructive pulmonary disease) ([place])   . CAD (coronary artery disease)   . Hypertension   . Hyperlipidemia   . Anxiety   . BPH (benign prostatic hyperplasia)   . Bipolar disorder ([place])   . Pneumonia 04/02/2016    Past Surgical History:  Procedure Laterality Date  . ABDOMINAL SURGERY    . APPENDECTOMY     Part   . CARDIAC CATHETERIZATION    . CARDIAC SURGERY    . CHOLECYSTECTOMY    . COLON SURGERY    . CORONARY ANGIOPLASTY    . CORONARY ANGIOPLASTY WITH STENT PLACEMENT     X7  . FOOT SURGERY Left 07/2013   tumor removed  . intraoperative sentinel lymph  node  07/2013   id with dye injection  . LIVER SURGERY     Part removed  . LYMPH NODE BIOPSY  07/2013    Prior to Admission medications   Medication Sig Start Date End Date Taking? Authorizing Provider  albuterol (PROVENTIL HFA;VENTOLIN HFA) 108 (90 Base) MCG/ACT inhaler Inhale 2 puffs into the lungs every 6 (six) hours as needed for wheezing or shortness of breath. 04/04/16   Loletha Grayer, MD  ARIPiprazole (ABILIFY) 5 MG tablet Take 1 tablet (5 mg total) by mouth daily. 04/04/16   Loletha Grayer, MD  aspirin 81 MG chewable tablet Chew 1 tablet (81 mg total) by mouth daily. 04/05/16   Loletha Grayer, MD  cefUROXime (CEFTIN) 500 MG tablet Take 1 tablet (500 mg total) by mouth 2 (two) times daily with a meal. 04/04/16   Leslye Peer, Richard, MD  fluticasone (FLONASE) 50 MCG/ACT nasal spray Place 1 spray into both nostrils daily. 04/04/16   Loletha Grayer, MD  gabapentin (NEURONTIN) 800 MG tablet Take 1 tablet (800 mg total) by mouth 3 (three) times daily. 04/04/16   Loletha Grayer, MD  hydrOXYzine (ATARAX/VISTARIL) 25 MG tablet Take 1 tablet (25 mg total) by mouth 3 (three) times daily as needed. 04/04/16   Loletha Grayer, MD  levocetirizine (XYZAL) 5 MG tablet Take 1  tablet (5 mg total) by mouth every evening. 04/04/16   Loletha Grayer, MD  mometasone-formoterol (DULERA) 100-5 MCG/ACT AERO Inhale 2 puffs into the lungs 2 (two) times daily. 04/04/16   Loletha Grayer, MD  nicotine (NICODERM CQ - DOSED IN MG/24 HOURS) 14 mg/24hr patch Place 1 patch (14 mg total) onto the skin daily. 04/05/16   Loletha Grayer, MD  nitroGLYCERIN (NITROSTAT) 0.4 MG SL tablet Place 1 tablet (0.4 mg total) under the tongue every 5 (five) minutes as needed for chest pain. 04/04/16   Loletha Grayer, MD  ondansetron (ZOFRAN) 8 MG tablet Take 1 tablet (8 mg total) by mouth every 12 (twelve) hours as needed for nausea or vomiting. 04/04/16   Loletha Grayer, MD  oxybutynin (DITROPAN-XL) 5 MG 24 hr tablet Take 1 tablet  (5 mg total) by mouth at bedtime. 04/04/16   Loletha Grayer, MD  pravastatin (PRAVACHOL) 20 MG tablet Take 1 tablet (20 mg total) by mouth daily. 04/04/16   Loletha Grayer, MD  predniSONE (DELTASONE) 5 MG tablet One tab daily for two days 04/04/16   Loletha Grayer, MD  ranitidine (ZANTAC) 150 MG tablet Take 1 tablet (150 mg total) by mouth 2 (two) times daily. 04/04/16   Loletha Grayer, MD  tiotropium (SPIRIVA) 18 MCG inhalation capsule Place 1 capsule (18 mcg total) into inhaler and inhale daily. 04/04/16   Loletha Grayer, MD    Allergies Patient has no known allergies.  Family History  Problem Relation Age of Onset  . Heart disease Mother   . Cancer Mother   . Heart attack Father     Social History Social History  Substance Use Topics  . Smoking status: Current Every Day Smoker    Packs/day: 1.00    Years: 50.00  . Smokeless tobacco: Never Used  . Alcohol use No    Review of Systems Constitutional: No fever/chills Eyes: No visual changes. ENT: No sore throat. Cardiovascular: Positive chest pain. Respiratory: Denies shortness of breath. Gastrointestinal: No abdominal pain.  No nausea, no vomiting.  No diarrhea.   Genitourinary: Negative for dysuria. Musculoskeletal: Negative for back pain. Skin: Negative for rash. Neurological: Negative for headaches, focal weakness or numbness.  ____________________________________________   PHYSICAL EXAM:  VITAL SIGNS: ED Triage Vitals  Enc Vitals Group     BP 11/04/16 1544 129/82     Pulse Rate 11/04/16 1544 (!) 119     Resp 11/04/16 1544 (!) 28     Temp 11/04/16 1544 98.1 F (36.7 C)     Temp Source 11/04/16 1544 Oral     SpO2 11/04/16 1544 94 %     Weight 11/04/16 1538 190 lb (86.2 kg)     Height 11/04/16 1538 5\' 9"  (1.753 m)     Head Circumference --      Peak Flow --      Pain Score 11/04/16 1538 5     Pain Loc --      Pain Edu? --      Excl. in [place]? --      Constitutional: Alert and oriented. Well  appearing and in no distress. Eyes: Conjunctivae are normal. Normal extraocular movements. ENT   Head: Normocephalic and atraumatic.   Nose: No congestion/rhinnorhea.   Mouth/Throat: Mucous membranes are moist.   Neck: No stridor. Hematological/Lymphatic/Immunilogical: No cervical lymphadenopathy. Cardiovascular: Normal rate, regular rhythm.  No murmurs, rubs, or gallops. Respiratory: Normal respiratory effort without tachypnea nor retractions. Breath sounds are clear and equal bilaterally. No wheezes/rales/rhonchi. Gastrointestinal: Soft and non tender.  No rebound. No guarding.  Genitourinary: Deferred Musculoskeletal: Normal range of motion in all extremities. No lower extremity edema. Neurologic:  Normal speech and language. No gross focal neurologic deficits are appreciated.  Skin:  Skin is warm, dry and intact. No rash noted. Psychiatric: Mood and affect are normal. Speech and behavior are normal. Patient exhibits appropriate insight and judgment.  ____________________________________________    LABS (pertinent positives/negatives)  Labs Reviewed  BASIC METABOLIC PANEL - Abnormal; Notable for the following:       Result Value   Sodium 131 (*)    Chloride 97 (*)    Glucose, Bld 237 (*)    Calcium 8.3 (*)    All other components within normal limits  CBC - Abnormal; Notable for the following:    RBC 4.08 (*)    HCT 37.3 (*)    Platelets 113 (*)    All other components within normal limits  TROPONIN I  TROPONIN I     ____________________________________________   EKG  I, Nance Pear, attending physician, personally viewed and interpreted this EKG  EKG Time: 1538 Rate: 116 Rhythm: sinus tachycardia with PVC Axis: normal Intervals: qtc 459 QRS: narrow, q waves V1 ST changes: no st elevation Impression: abnormal ekg  ____________________________________________    RADIOLOGY  CXR IMPRESSION:  Peribronchial thickening which may relate to  chronic bronchitis or  smoking.    Possible bibasilar airspace opacities, especially at the right lung  base laterally. If symptoms persist or warrant, consider  radiographic follow-up at 3-4 days to exclude early pneumonia.   ____________________________________________   PROCEDURES  Procedures  ____________________________________________   INITIAL IMPRESSION / ASSESSMENT AND PLAN / ED COURSE  Pertinent labs & imaging results that were available during my care of the patient were reviewed by me and considered in my medical decision making (see chart for details).  Patient presented to the emergency department today because of concerns for chest pain. Patient to negative troponins here. Patient pain has been going on for a couple days and I would expect some elevation. The patient's chest x-ray however is concerning for possible pneumonia. This could certainly explain the patient's symptoms. Will give patient prescription for antibiotics.  ____________________________________________   FINAL CLINICAL IMPRESSION(S) / ED DIAGNOSES  Final diagnoses:  Chest pain, unspecified type  Community acquired pneumonia, unspecified laterality     Note: This dictation was prepared with Sales executive. Any transcriptional errors that result from this process are unintentional     Nance Pear, MD 11/04/16 2036

## 2017-12-27 IMAGING — CR DG CHEST 2V
1 series · 2 of 2 positions shown · non-contrast
Comparison: 04/02/2016 CT and plain film.

CLINICAL DATA: Hx of CAD, CHF, COPD, HTN and MI. Smoker. Hx of
cardiac stents. Per pt- c/o diffuse chest pain for 3 days
intermittent.

EXAM:
CHEST  2 VIEW

[Series 1: dg chest 2 view · 0.14mm/px · 2 of 2 slices shown]
[im 1/2]
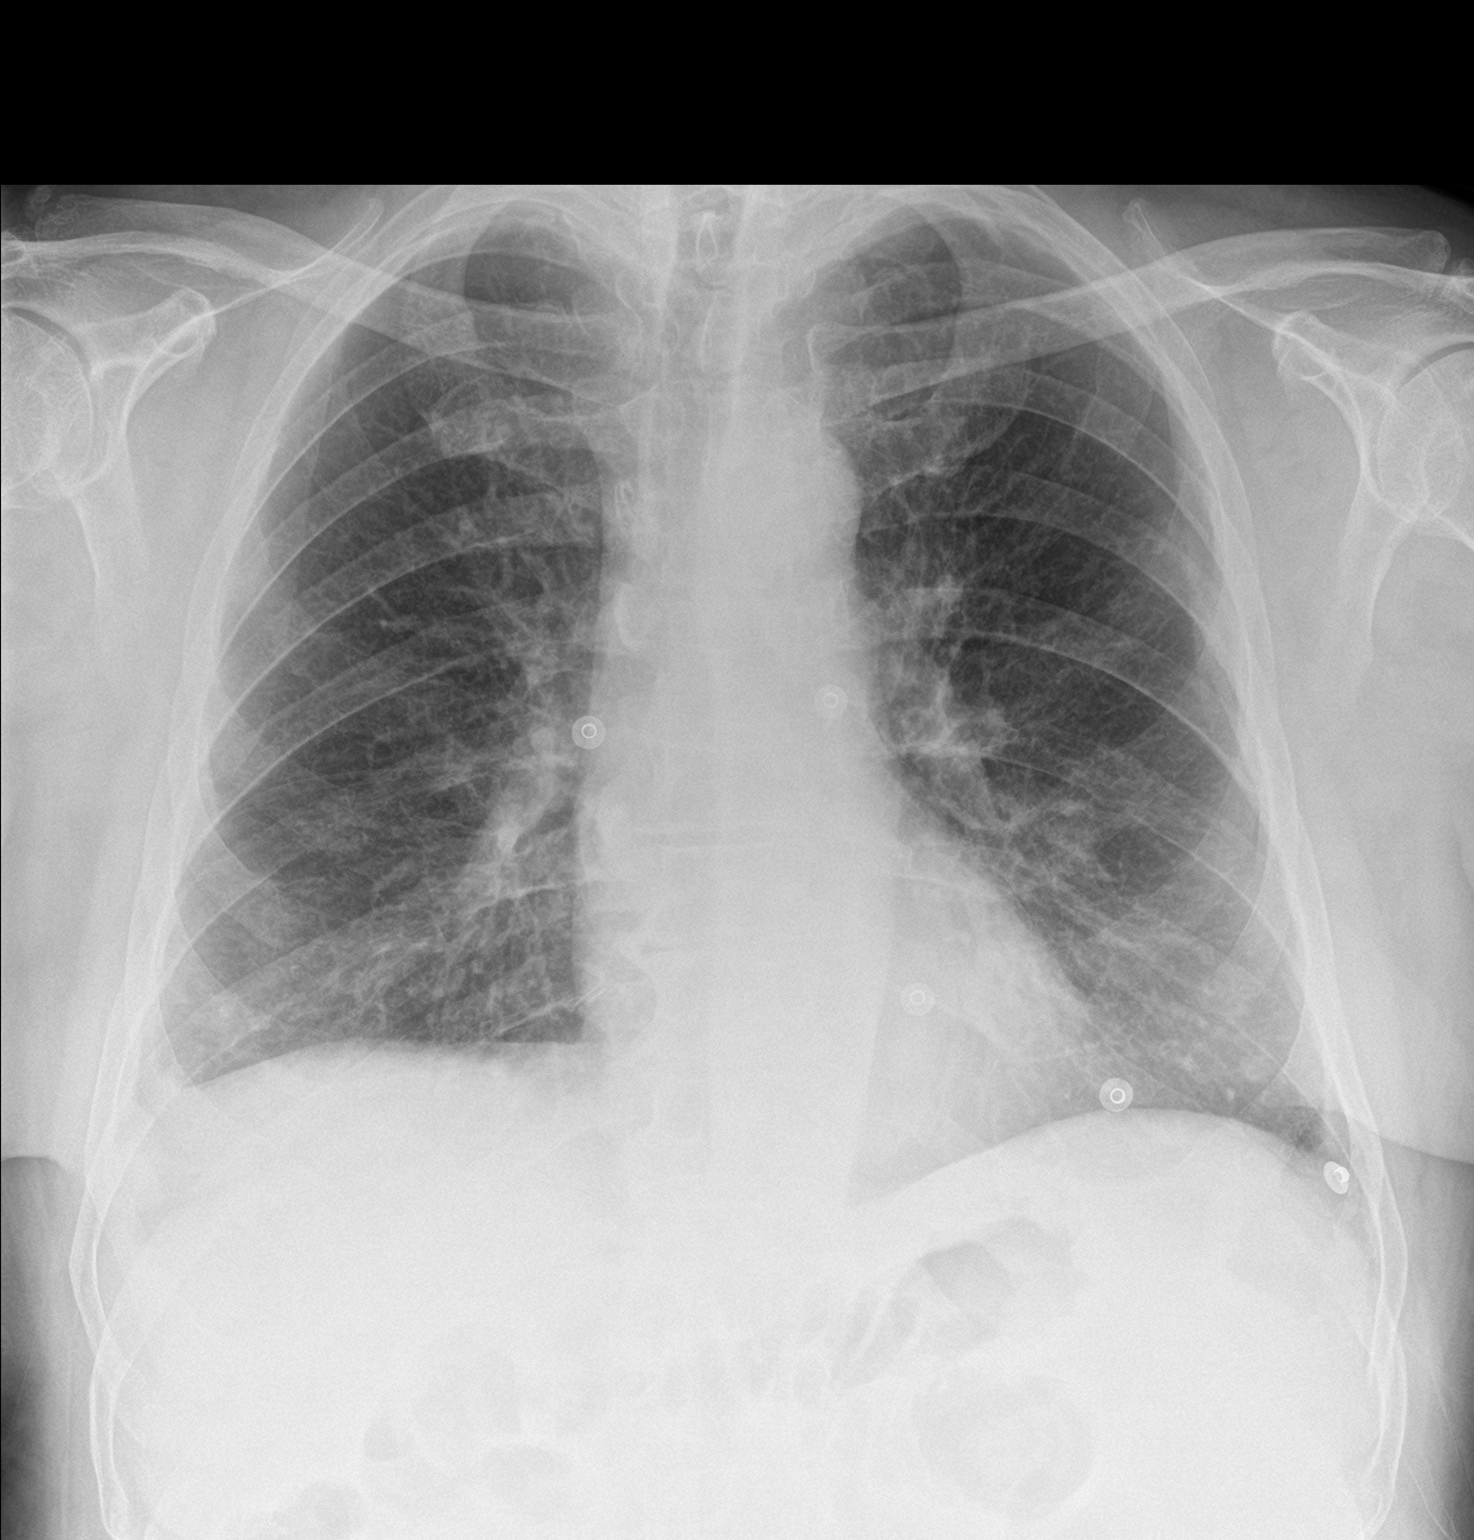
[im 2/2]
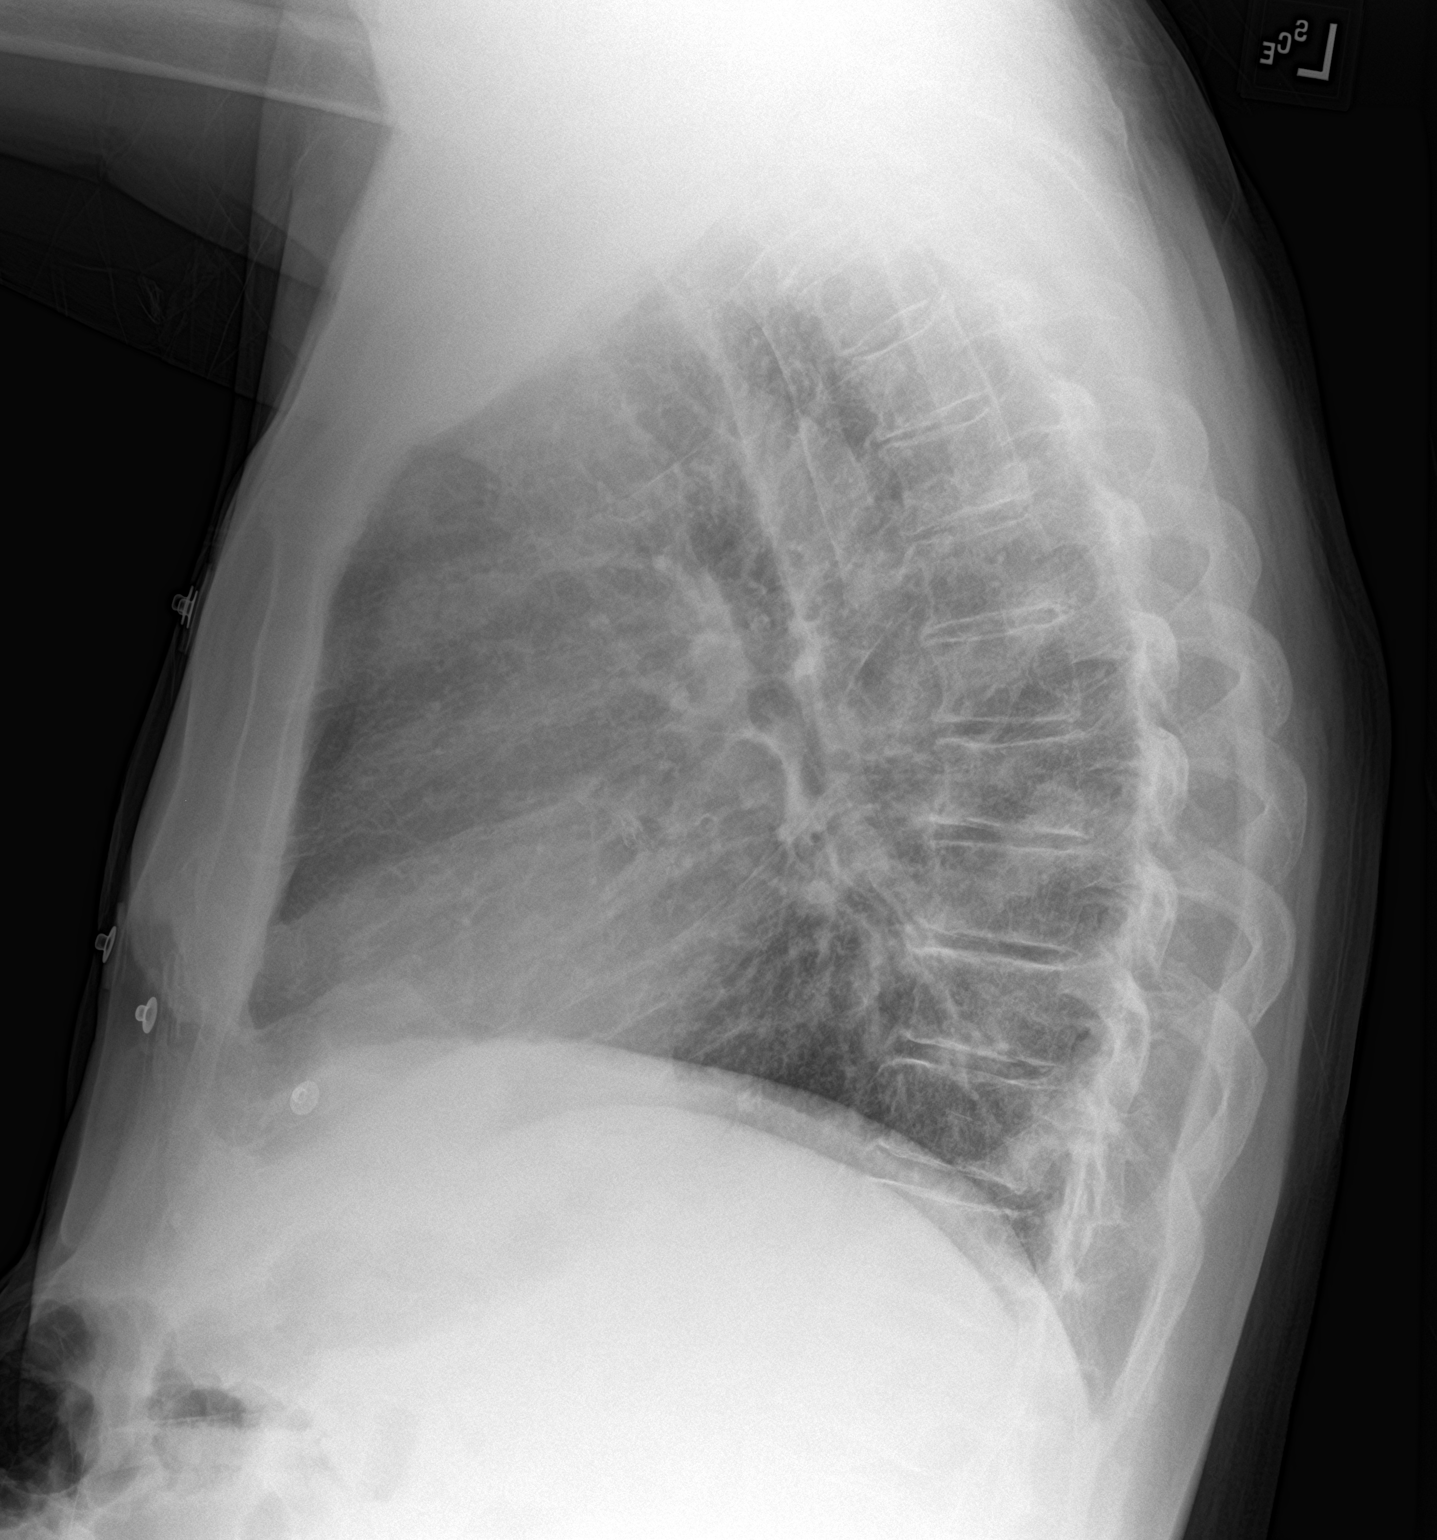

[2 of 2 positions shown; findings below may reference images not displayed]

FINDINGS: Midline trachea. Normal heart size and mediastinal contours. No
pleural effusion or pneumothorax. Lower lobe predominant
interstitial thickening is again identified. Possible patchy
airspace disease in the lateral lung bases bilaterally.
IMPRESSION: Peribronchial thickening which may relate to chronic bronchitis or
smoking.

Possible bibasilar airspace opacities, especially at the right lung
base laterally. If symptoms persist or warrant, consider
radiographic follow-up at 3-4 days to exclude early pneumonia.
